# Patient Record
Sex: Female | Born: 1950 | Race: White | Hispanic: No | Marital: Married | Smoking: Former smoker
Health system: Southern US, Community
[De-identification: ages and names within clinical notes are randomized; demographics above are authoritative.]

## PROBLEM LIST (undated history)

## (undated) DIAGNOSIS — L409 Psoriasis, unspecified: Secondary | ICD-10-CM

## (undated) DIAGNOSIS — M2550 Pain in unspecified joint: Secondary | ICD-10-CM

## (undated) DIAGNOSIS — E039 Hypothyroidism, unspecified: Secondary | ICD-10-CM

## (undated) DIAGNOSIS — E785 Hyperlipidemia, unspecified: Secondary | ICD-10-CM

## (undated) DIAGNOSIS — Z8619 Personal history of other infectious and parasitic diseases: Secondary | ICD-10-CM

## (undated) DIAGNOSIS — I10 Essential (primary) hypertension: Secondary | ICD-10-CM

## (undated) DIAGNOSIS — M1990 Unspecified osteoarthritis, unspecified site: Secondary | ICD-10-CM

## (undated) DIAGNOSIS — K222 Esophageal obstruction: Secondary | ICD-10-CM

## (undated) DIAGNOSIS — K219 Gastro-esophageal reflux disease without esophagitis: Secondary | ICD-10-CM

## (undated) DIAGNOSIS — Z860100 Personal history of colon polyps, unspecified: Secondary | ICD-10-CM

## (undated) DIAGNOSIS — G4700 Insomnia, unspecified: Secondary | ICD-10-CM

## (undated) DIAGNOSIS — K2270 Barrett's esophagus without dysplasia: Secondary | ICD-10-CM

## (undated) DIAGNOSIS — M2540 Effusion, unspecified joint: Secondary | ICD-10-CM

## (undated) DIAGNOSIS — R531 Weakness: Secondary | ICD-10-CM

## (undated) DIAGNOSIS — G8929 Other chronic pain: Secondary | ICD-10-CM

## (undated) DIAGNOSIS — Z9289 Personal history of other medical treatment: Secondary | ICD-10-CM

## (undated) DIAGNOSIS — K5790 Diverticulosis of intestine, part unspecified, without perforation or abscess without bleeding: Secondary | ICD-10-CM

## (undated) DIAGNOSIS — M549 Dorsalgia, unspecified: Secondary | ICD-10-CM

## (undated) DIAGNOSIS — I359 Nonrheumatic aortic valve disorder, unspecified: Secondary | ICD-10-CM

## (undated) DIAGNOSIS — H269 Unspecified cataract: Secondary | ICD-10-CM

## (undated) DIAGNOSIS — G5600 Carpal tunnel syndrome, unspecified upper limb: Secondary | ICD-10-CM

## (undated) HISTORY — DX: Barrett's esophagus without dysplasia: K22.70

## (undated) HISTORY — DX: Hypothyroidism, unspecified: E03.9

## (undated) HISTORY — PX: COLONOSCOPY: SHX174

## (undated) HISTORY — PX: OTHER SURGICAL HISTORY: SHX169

## (undated) HISTORY — DX: Esophageal obstruction: K22.2

## (undated) HISTORY — DX: Nonrheumatic aortic valve disorder, unspecified: I35.9

## (undated) HISTORY — PX: TUBAL LIGATION: SHX77

## (undated) HISTORY — PX: CARPAL TUNNEL RELEASE: SHX101

## (undated) HISTORY — DX: Essential (primary) hypertension: I10

## (undated) HISTORY — PX: TRIGGER FINGER RELEASE: SHX641

## (undated) HISTORY — PX: ESOPHAGOGASTRODUODENOSCOPY: SHX1529

---
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
## 1998-06-23 ENCOUNTER — Ambulatory Visit (HOSPITAL_COMMUNITY): Admission: RE | Admit: 1998-06-23 | Discharge: 1998-06-23 | Payer: Self-pay | Admitting: Obstetrics and Gynecology

## 1998-06-23 ENCOUNTER — Encounter: Payer: Self-pay | Admitting: Obstetrics and Gynecology

## 1998-12-11 ENCOUNTER — Other Ambulatory Visit: Admission: RE | Admit: 1998-12-11 | Discharge: 1998-12-11 | Payer: Self-pay | Admitting: Obstetrics and Gynecology

## 2000-04-07 ENCOUNTER — Other Ambulatory Visit: Admission: RE | Admit: 2000-04-07 | Discharge: 2000-04-07 | Payer: Self-pay | Admitting: Obstetrics and Gynecology

## 2001-04-20 ENCOUNTER — Ambulatory Visit (HOSPITAL_COMMUNITY): Admission: RE | Admit: 2001-04-20 | Discharge: 2001-04-20 | Payer: Self-pay | Admitting: Gastroenterology

## 2001-07-01 ENCOUNTER — Other Ambulatory Visit: Admission: RE | Admit: 2001-07-01 | Discharge: 2001-07-01 | Payer: Self-pay | Admitting: Obstetrics and Gynecology

## 2002-08-04 ENCOUNTER — Other Ambulatory Visit: Admission: RE | Admit: 2002-08-04 | Discharge: 2002-08-04 | Payer: Self-pay | Admitting: Obstetrics and Gynecology

## 2003-10-06 ENCOUNTER — Other Ambulatory Visit: Admission: RE | Admit: 2003-10-06 | Discharge: 2003-10-06 | Payer: Self-pay | Admitting: *Deleted

## 2005-01-10 ENCOUNTER — Other Ambulatory Visit: Admission: RE | Admit: 2005-01-10 | Discharge: 2005-01-10 | Payer: Self-pay | Admitting: Obstetrics and Gynecology

## 2005-10-31 ENCOUNTER — Inpatient Hospital Stay (HOSPITAL_BASED_OUTPATIENT_CLINIC_OR_DEPARTMENT_OTHER): Admission: RE | Admit: 2005-10-31 | Discharge: 2005-10-31 | Payer: Self-pay | Admitting: *Deleted

## 2006-11-27 ENCOUNTER — Ambulatory Visit (HOSPITAL_COMMUNITY): Admission: RE | Admit: 2006-11-27 | Discharge: 2006-11-27 | Payer: Self-pay | Admitting: *Deleted

## 2006-11-27 ENCOUNTER — Encounter (INDEPENDENT_AMBULATORY_CARE_PROVIDER_SITE_OTHER): Payer: Self-pay | Admitting: *Deleted

## 2006-12-02 ENCOUNTER — Ambulatory Visit: Payer: Self-pay | Admitting: Surgery

## 2006-12-02 HISTORY — PX: OTHER SURGICAL HISTORY: SHX169

## 2006-12-09 ENCOUNTER — Ambulatory Visit: Payer: Self-pay | Admitting: Surgery

## 2006-12-10 ENCOUNTER — Inpatient Hospital Stay (HOSPITAL_COMMUNITY): Admission: RE | Admit: 2006-12-10 | Discharge: 2006-12-15 | Payer: Self-pay | Admitting: Surgery

## 2006-12-10 ENCOUNTER — Ambulatory Visit: Payer: Self-pay | Admitting: Surgery

## 2006-12-10 ENCOUNTER — Encounter (INDEPENDENT_AMBULATORY_CARE_PROVIDER_SITE_OTHER): Payer: Self-pay | Admitting: Specialist

## 2007-01-06 ENCOUNTER — Ambulatory Visit: Payer: Self-pay | Admitting: Surgery

## 2007-01-12 ENCOUNTER — Encounter (HOSPITAL_COMMUNITY): Admission: RE | Admit: 2007-01-12 | Discharge: 2007-04-12 | Payer: Self-pay | Admitting: *Deleted

## 2007-01-22 ENCOUNTER — Emergency Department (HOSPITAL_COMMUNITY): Admission: EM | Admit: 2007-01-22 | Discharge: 2007-01-22 | Payer: Self-pay | Admitting: Emergency Medicine

## 2007-04-13 ENCOUNTER — Encounter (HOSPITAL_COMMUNITY): Admission: RE | Admit: 2007-04-13 | Discharge: 2007-05-03 | Payer: Self-pay | Admitting: *Deleted

## 2009-01-05 ENCOUNTER — Encounter: Admission: RE | Admit: 2009-01-05 | Discharge: 2009-01-05 | Payer: Self-pay | Admitting: Gastroenterology

## 2009-02-16 ENCOUNTER — Ambulatory Visit (HOSPITAL_COMMUNITY): Admission: RE | Admit: 2009-02-16 | Discharge: 2009-02-16 | Payer: Self-pay | Admitting: Gastroenterology

## 2009-02-16 ENCOUNTER — Encounter (INDEPENDENT_AMBULATORY_CARE_PROVIDER_SITE_OTHER): Payer: Self-pay | Admitting: Gastroenterology

## 2009-05-20 IMAGING — CR DG CHEST 1V PORT
1 series · 1 of 1 positions shown · non-contrast
Comparison: none

HISTORY: Chest pain

PORTABLE CHEST ONE VIEW:
Portable exam 2292 hours compared to 12/12/2006
Normal heart size status post median sternotomy and AVR.
Mediastinal contours and pulmonary vascularity normal.
Lungs clear.
No pleural effusion or pneumothorax.

[view not recorded]
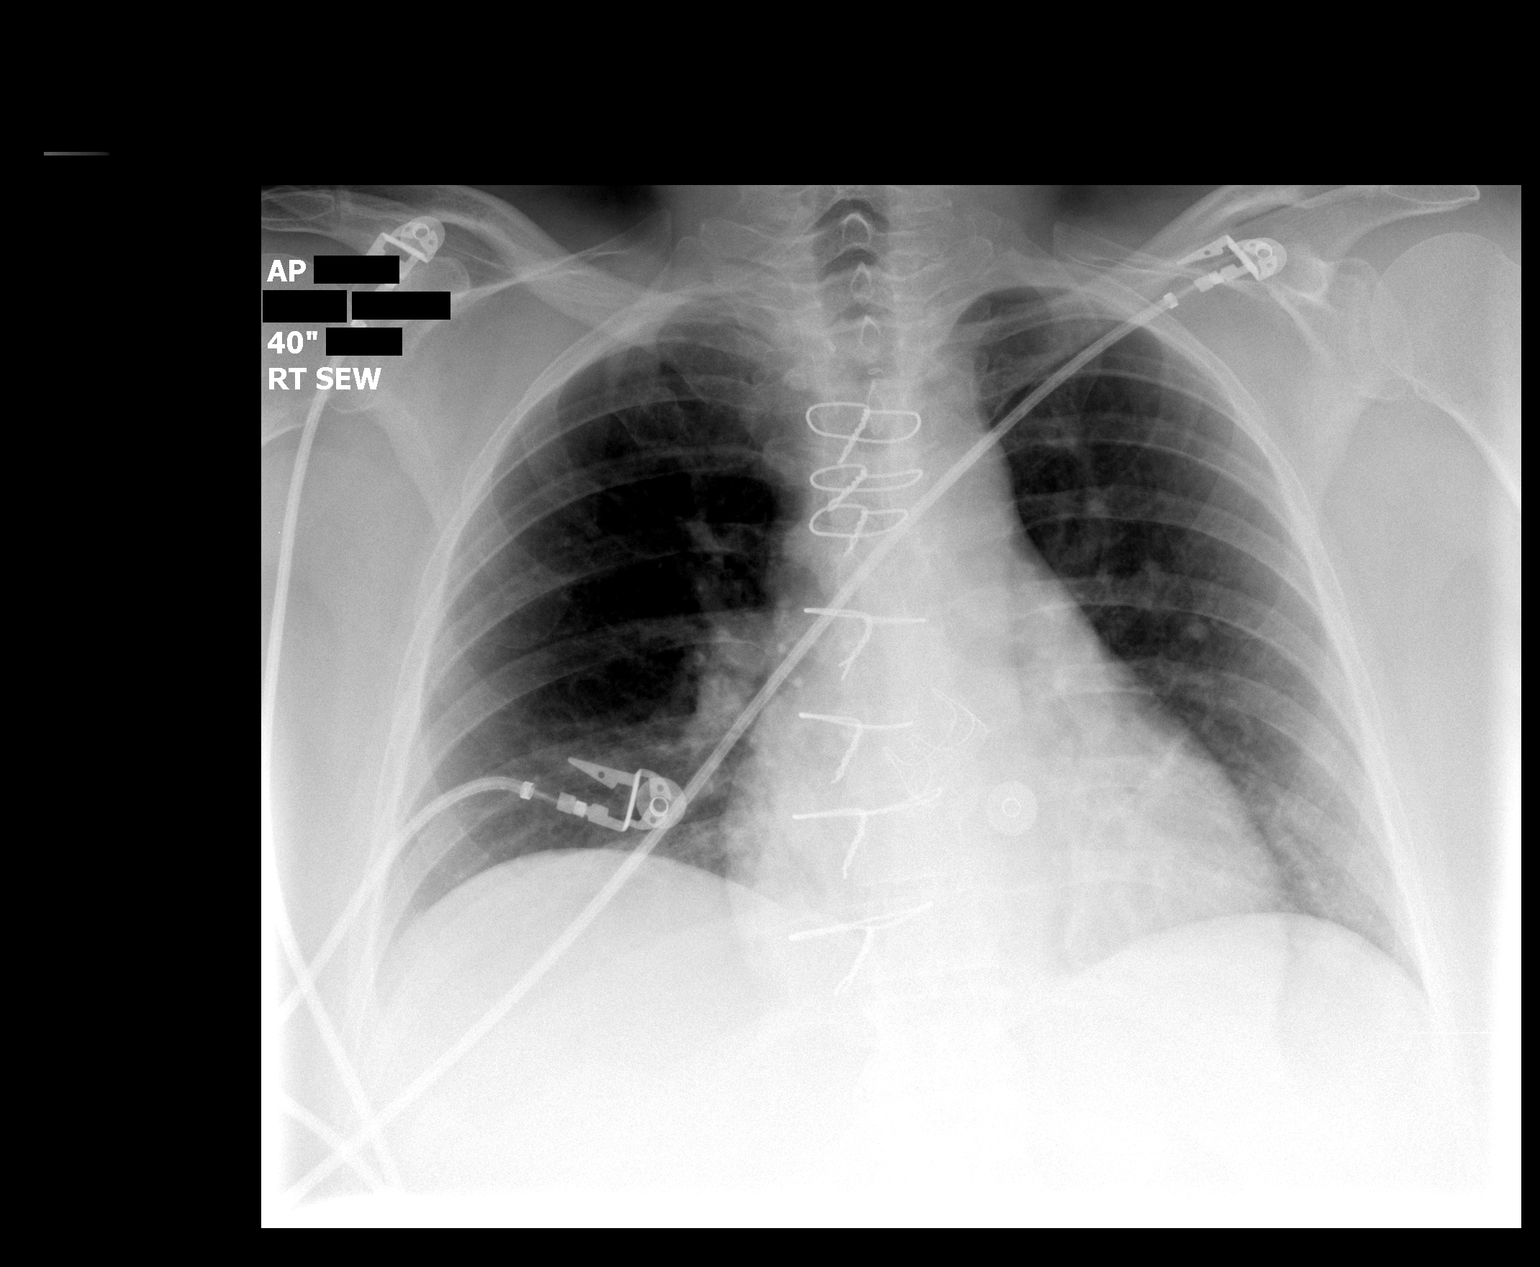

[1 of 1 positions shown; findings below may reference images not displayed]

IMPRESSION: No acute abnormalities.

## 2009-05-20 IMAGING — CT CT ANGIO CHEST
2 of 4 series · 19 of 36 positions shown · IV contrast (APPLIED)
Comparison: none

HISTORY: Dyspnea, chest pain, elevated d-dimer, question pulmonary embolism

[Series 5: pulm embolism 2.0 st · axial · 0.63mm/px · z∈[-330,-90]mm · 16 of 132 slices shown]
[im 6/132  lung]
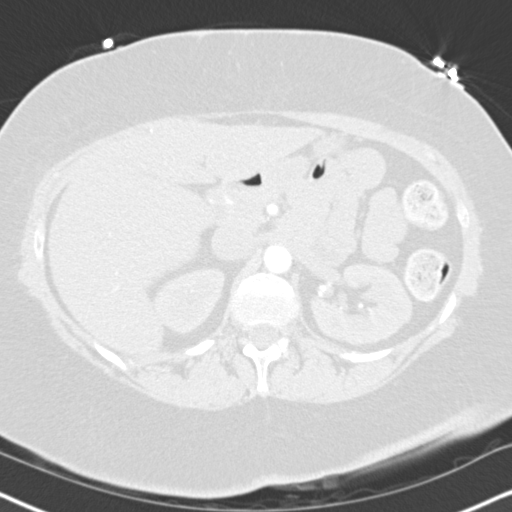
[im 16/132  mediastinal]
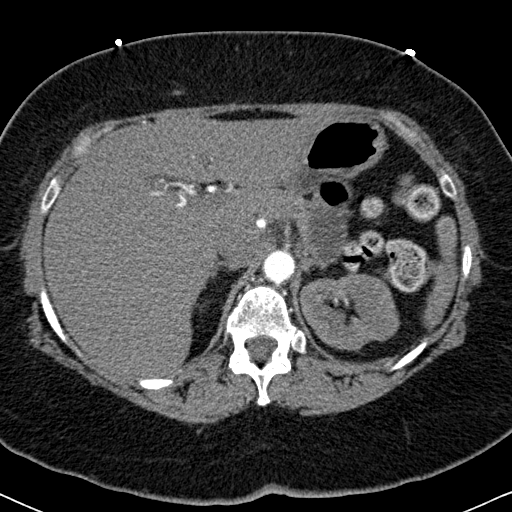
[im 21/132  lung]
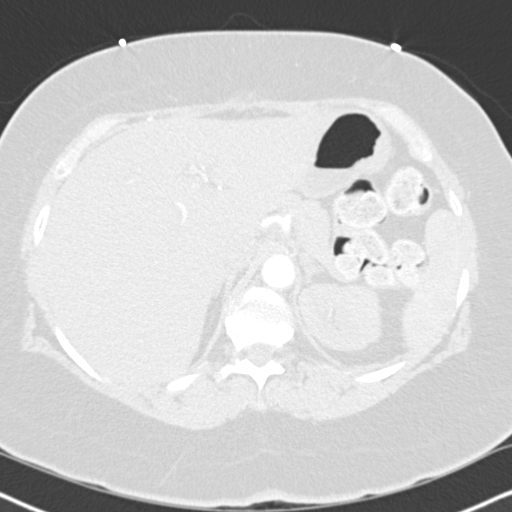
[im 31/132  mediastinal]
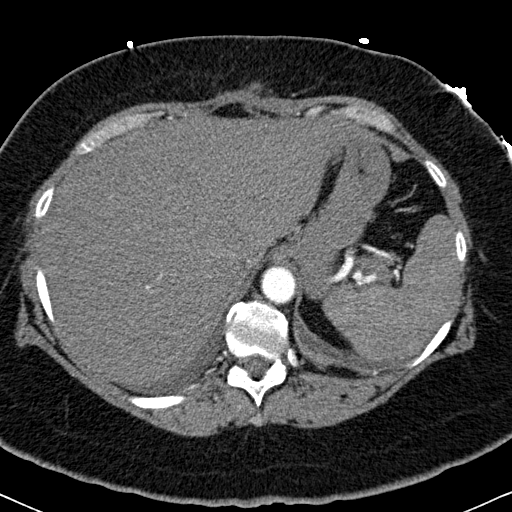
[im 41/132  lung]
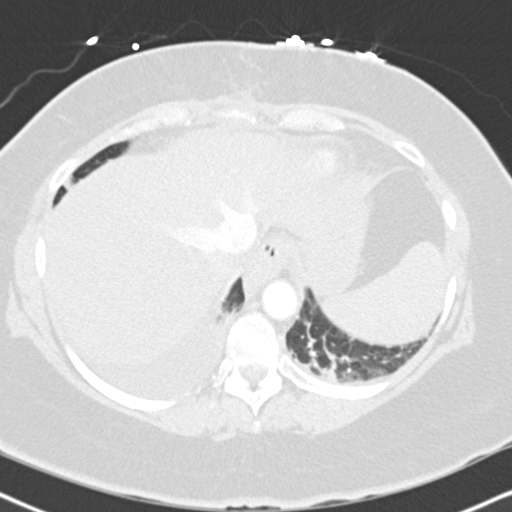
[im 46/132  mediastinal]
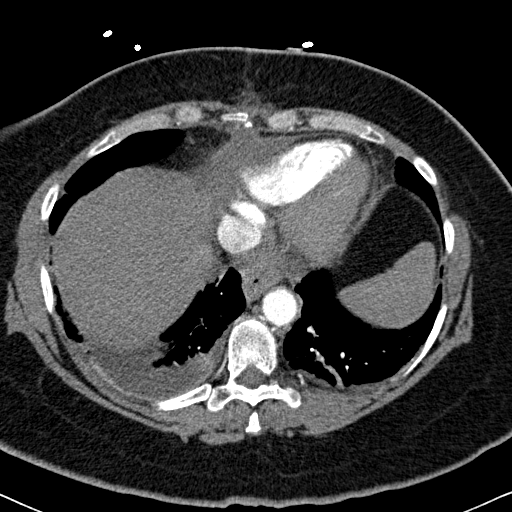
[im 56/132  lung]
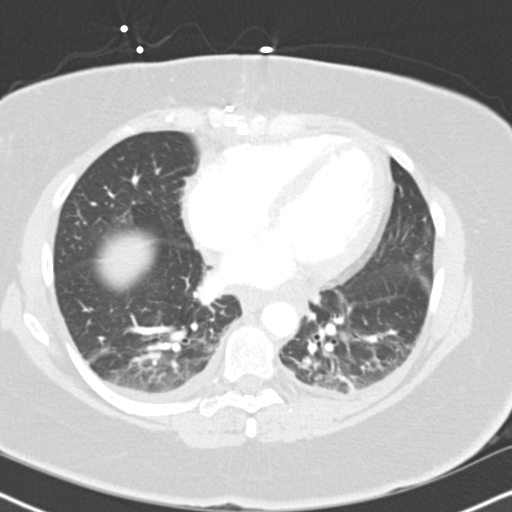
[im 61/132  mediastinal]
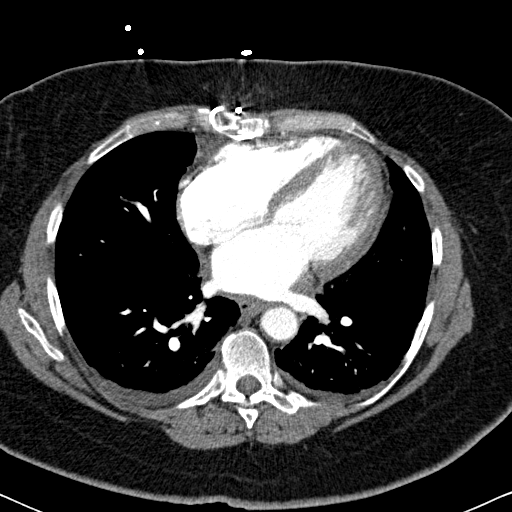
[im 71/132  lung]
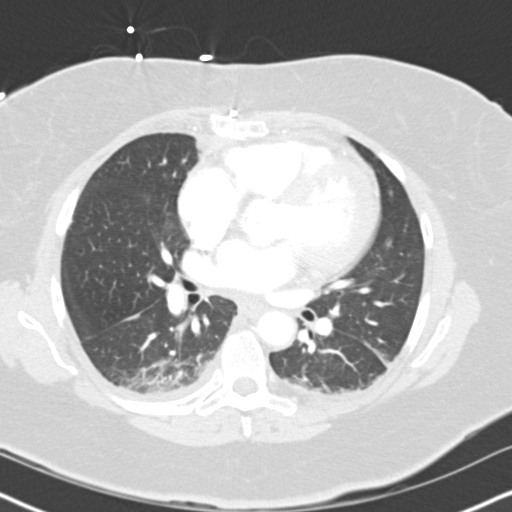
[im 76/132  mediastinal]
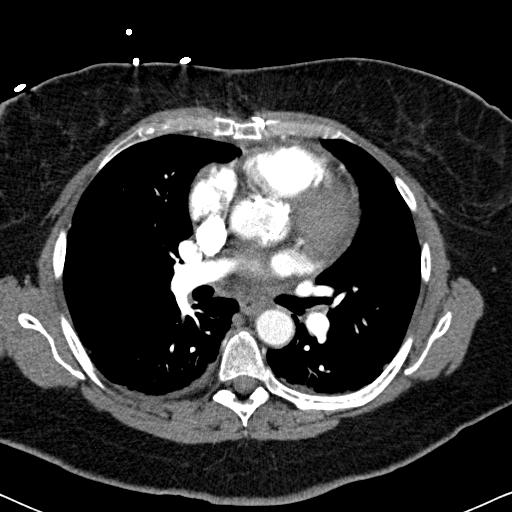
[im 86/132  lung]
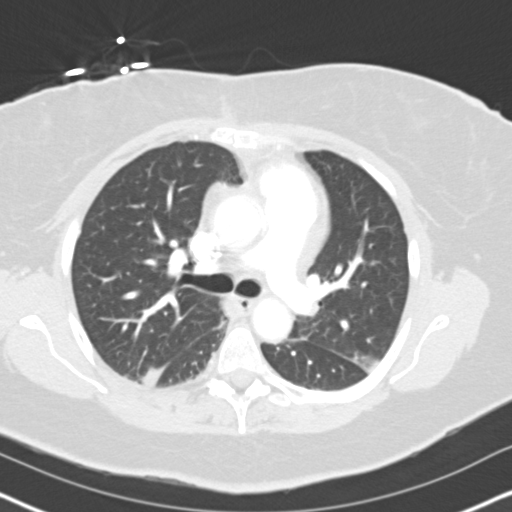
[im 91/132  mediastinal]
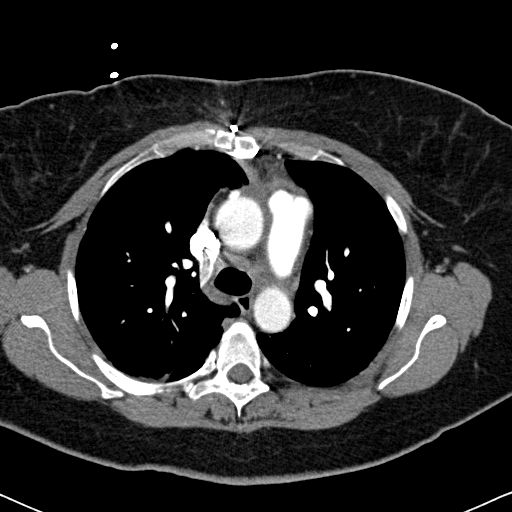
[im 101/132  lung]
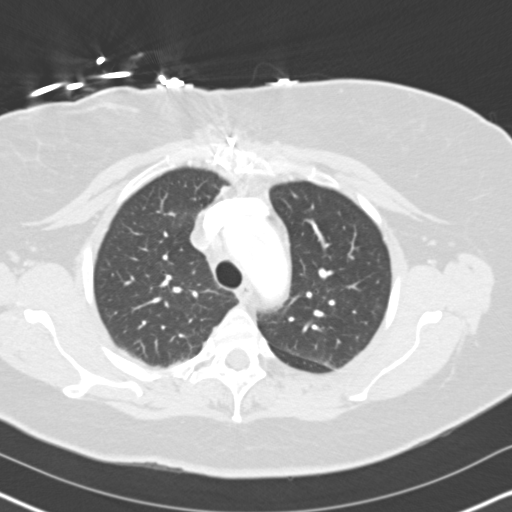
[im 111/132  mediastinal]
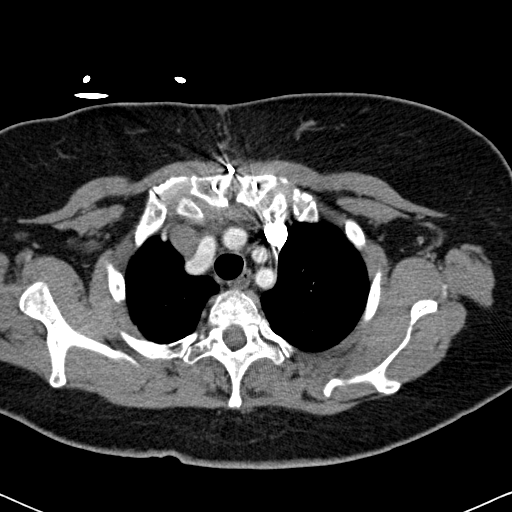
[im 116/132  lung]
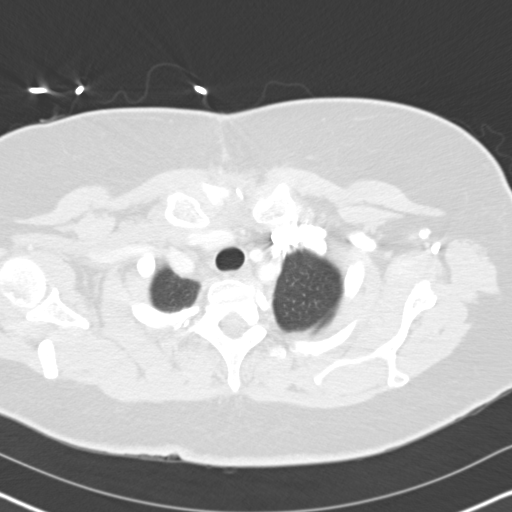
[im 126/132  mediastinal]
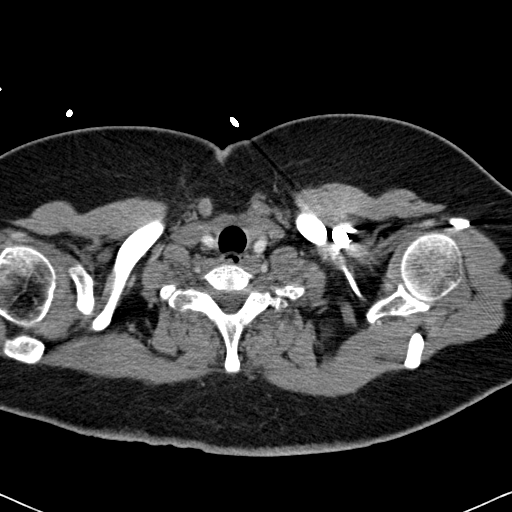

[Series 7: pulm embolism 2.0 cor · coronal · 0.63mm/px · 3 of 108 slices shown]
[im 22/108  mediastinal]
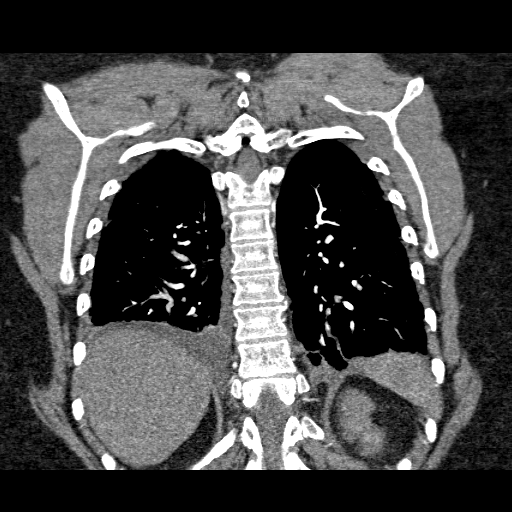
[im 43/108  mediastinal]
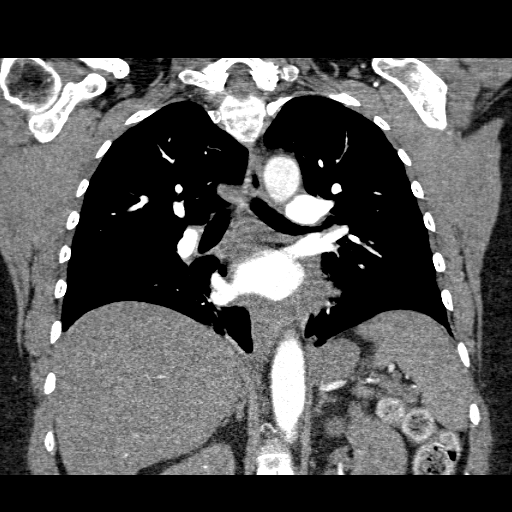
[im 65/108  mediastinal]
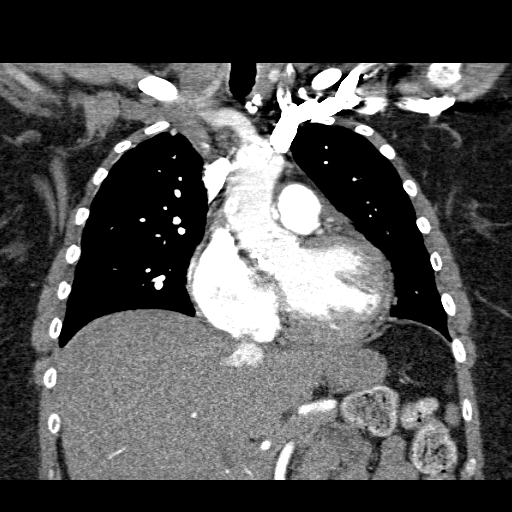

[19 of 36 positions shown; findings below may reference images not displayed]

CT ANGIO CHEST:

Multidetector helical CT imaging chest performed using pulmonary embolism
protocol following 80 cc [id number]. Additional coronal and sagittal images
are reconstructed from the axial data set to assist in evaluation of the
thoracic vasculature.
No prior exam for comparison.

Aorta normal caliber without aneurysm or dissection.
Status post CABG with stranding in anterior mediastinum compatible with recent
surgery.
Minimal pericardial thickening.
Small bilateral pleural effusions, greater on right.
Pulmonary arteries patent.
No evidence of pulmonary embolism.
Visualized portions of upper abdomen unremarkable.
Remaining lungs clear.
IMPRESSION: Postsurgical changes of CABG.
Small bilateral pleural effusions and bibasilar atelectasis.
No evidence of pulmonary embolism.

## 2009-08-16 ENCOUNTER — Encounter: Admission: RE | Admit: 2009-08-16 | Discharge: 2009-08-16 | Payer: Self-pay | Admitting: Obstetrics and Gynecology

## 2010-10-06 ENCOUNTER — Encounter: Payer: Self-pay | Admitting: Internal Medicine

## 2010-10-10 ENCOUNTER — Ambulatory Visit
Admission: RE | Admit: 2010-10-10 | Discharge: 2010-10-10 | Disposition: A | Discharge status: Home / Self Care | Payer: BC Managed Care – PPO | Source: Ambulatory Visit | Attending: Internal Medicine | Admitting: Internal Medicine

## 2010-10-10 ENCOUNTER — Other Ambulatory Visit: Payer: Self-pay | Admitting: Internal Medicine

## 2010-10-10 DIAGNOSIS — M545 Low back pain, unspecified: Secondary | ICD-10-CM

## 2010-10-11 ENCOUNTER — Other Ambulatory Visit: Payer: Self-pay | Admitting: Neurological Surgery

## 2010-10-11 ENCOUNTER — Ambulatory Visit
Admission: RE | Admit: 2010-10-11 | Discharge: 2010-10-11 | Disposition: A | Discharge status: Home / Self Care | Payer: BC Managed Care – PPO | Source: Ambulatory Visit | Attending: Neurological Surgery | Admitting: Neurological Surgery

## 2010-10-11 DIAGNOSIS — M4316 Spondylolisthesis, lumbar region: Secondary | ICD-10-CM

## 2011-01-09 ENCOUNTER — Other Ambulatory Visit: Payer: Self-pay | Admitting: *Deleted

## 2011-01-09 MED ORDER — LISINOPRIL 20 MG PO TABS: 20.0000 mg | ORAL_TABLET | Freq: Every day | ORAL | Status: DC

## 2011-01-09 NOTE — Telephone Encounter (Signed)
Refill to [facility]

## 2011-01-15 NOTE — Op Note (Signed)
NAMEJINX, Amy Russo               ACCOUNT NO.:  [id number]   MEDICAL RECORD NO.:  [id number]          PATIENT TYPE:  AMB   LOCATION:  ENDO                         FACILITY:  [facility]   PHYSICIAN:  Bernette Redbird, M.D.   DATE OF BIRTH:  Sep 16, 1950   DATE OF PROCEDURE:  02/16/2009  DATE OF DISCHARGE:                               OPERATIVE REPORT   PROCEDURE:  Upper endoscopy with Savary dilatation of the esophagus  under fluoroscopy, and biopsies.   INDICATION:  A 60 year old female with recurrent dysphagia symptoms and  transient food impactions, and barium swallow showing a tight narrowing  in the region of the distal esophagus.   FINDINGS:  Desquamating severe esophagitis with ring-like narrowing but  no discrete esophageal ring at the gastroesophageal junction, dilated to  18 mm.  Small hiatal hernia.   PROCEDURE:  The patient came as an outpatient to the [facility]  Endoscopy Unit and provided written consent.  Sedation was MAC by the  anesthesia team.  The Pentax video endoscope was passed under direct  vision.  The vocal cords were not well seen.   The esophagus was normal proximally but, for several centimeters above  the GE junction, there were areas of inflammation characterized by  exudate, and desquamating mucosa, and underlying erythematous mucosa  which I biopsied at the conclusion of the procedure to see if it was  Barrett's mucosa.  There was a ring-like narrowing but it was fairly  widely patent and offered no resistance passage of the endoscope.  Below  this was a 3-cm hiatal hernia.   The stomach was entered.  It contained no significant residual.  The  gastric mucosa was normal, and no gastritis, erosions, ulcers, polyps or  masses were observed.  A retroflexed view of the cardia of the stomach  was unremarkable and the pylorus, duodenal bulb and second duodenum  looked normal.   Savary dilatation was then performed in the standard fashion.  The  spring  tipped guidewire was passed into the antrum of the stomach and  the scope was removed in an exchange fashion, leaving the guidewire in  place.  Sequential dilatation was then performed under fluoroscopic  guidance, using Savary dilators, sizes 16 and 18 mm, with smooth  resistance and no discrete pop or click as I passed and below the  level of the diaphragm as confirmed fluoroscopically.  Reinspection  endoscopically after passage of the larger dilator showed some fresh  hemorrhage but no brisk bleeding.  There might have been some ongoing  oozing.  Several biopsies were obtained from the distal esophagus as  noted above.  There was no endoscopic evidence of undue trauma or  perforation.  The scope was removed from the patient who tolerated the  procedure well and without apparent complications.   IMPRESSION:  1. Distal esophagitis and possible Barrett's esophagus.  2. Distal esophageal narrowing, dilated to 18 mm as described above.  3. Small hiatal hernia.   PLAN:  Await pathology results and clinical follow-up of dysphagia  symptoms.           ______________________________  Bernette Redbird, M.D.     RB/MEDQ  D:  02/16/2009  T:  02/16/2009  Job:  [id number]   cc:   Geoffry Paradise, M.D.  Fax: [phone]

## 2011-01-15 NOTE — Op Note (Signed)
NAMECAMIRA, Amy Russo               ACCOUNT NO.:  [id number]   MEDICAL RECORD NO.:  [id number]          PATIENT TYPE:  AMB   LOCATION:  ENDO                         FACILITY:  [facility]   PHYSICIAN:  Bernette Redbird, M.D.   DATE OF BIRTH:  06/24/1951   DATE OF PROCEDURE:  02/16/2009  DATE OF DISCHARGE:                               OPERATIVE REPORT   PROCEDURE:  Flexible sigmoidoscopy.   INDICATIONS:  A 60 year old female with recurrent small-volume  hematochezia in association with significant constipation.  Colonoscopy  2-1/2 years ago was unrevealing as was sigmoidoscopic evaluation about a  year ago.   FINDINGS:  Normal exam to 40 cm.  No obvious source of bleeding  identified.   PROCEDURE:  The patient provided written consent for the procedure,  having come as an outpatient to the [facility] Endoscopy Unit.  This  procedure was performed immediately following her upper endoscopy and  the endoscope was used as a sigmoidoscope for this exam.  It was  inserted after a Fleet's enema prep and easily advanced to 40 cm.  Pullback was then performed.   This was a normal examination.  No polyps, masses, proctitis, colitis,  vascular ectasia or diverticulosis were noted.  Retroflexion in the  rectum showed perhaps some slightly excoriated distal rectal mucosa but  nothing impressive.  Pullout through the anal canal with careful  inspection failed to show any obvious anal fissures nor were there was  really significant internal hemorrhoids.  No biopsies were obtained.  The patient tolerated the procedure well and with no apparent  complications.   IMPRESSION:  Essentially normal sigmoidoscopy, without source of rectal  bleeding endoscopically evident.   PLAN:  Clinical follow-up.   Note that for this procedure and the upper endoscopy which preceded it,  the patient received antibiotic prophylaxis with ampicillin and  gentamicin, because of the history of the prosthetic tissue  aortic  valve.           ______________________________  Bernette Redbird, M.D.     RB/MEDQ  D:  02/16/2009  T:  02/16/2009  Job:  [id number]   cc:   Geoffry Paradise, M.D.  Fax: [phone]

## 2011-01-18 NOTE — Op Note (Signed)
Amy Russo, Amy Russo               ACCOUNT NO.:  [id number]   MEDICAL RECORD NO.:  [id number]          PATIENT TYPE:  INP   LOCATION:  2302                         FACILITY:  [facility]   PHYSICIAN:  Burna Forts, M.D.DATE OF BIRTH:  1950-11-29   DATE OF PROCEDURE:  DATE OF DISCHARGE:                               OPERATIVE REPORT   PROCEDURE:  Intraoperative transesophageal echocardiogram.   INDICATIONS FOR PROCEDURE:  Amy Russo is a 60 year old woman who  presents today for aortic valve replacement to be performed by Dr. Evelene Croon.   She is brought to the holding area the morning of surgery under local  anesthesia with sedation, the radial, arterial and pulmonary artery  lines are inserted.  She is then taken to the OR for routine induction  of general anesthesia.   After satisfactory induction of anesthesia, the TEE probe is protected,  lubricated and passed oropharyngeally into the stomach and slightly  withdrawn for imaging on cardiac structures.   THE PRECARDIAC PULMONARY TEE EXAMINATION:  Left ventricle:  Left  ventricular chamber seen in a short axis view revealed concentric left  ventricular hypertrophy.  There is excellent overall contractility noted  in all segments of wall areas particularly and in the long axis view  again showing good overall contractility.  Papillary muscles are well  outlined.  There are no masses noted within.  Mitral valve:  There is a mildly thickened mitral valve apparatus noted  in both anterior and posterior leaflets seen in a 4-chamber view.  The  leaflets themselves lifted appropriately during diastolic filling and  closed satisfactorily during systolic ejection.  The color Doppler  across this valve again shows only trace regurgitant flow noted in this  4-chamber view.  Multiple views are taken of the mitral valves and  essentially normal apparatus.  Left atrium:  Normal left atrial chamber.  No masses are noted within.  Intraatrial septum is interrogated and intact.  Right ventricle, tricuspid valve, and right atrium are essentially  normal chambers and structures in their form and function.  Aortic valve:  The aortic valve is bounded by sort of heavy calcium  noted in the annular area and a short axis view it is seen to be  essentially a bicuspid valve with what appears to be essentially a left  and a right cusp.  The cusps are essentially quite fixed and limited in  their opening.  During systolic ejection across this valve, there is a  3+ systolic ejection jet showing turbulent flows with a central  turbulent area of jet noted in the short and long axis views.  Hemodynamic calculations of the valve and across the valve indicate a  peak gradient around 44 and a mean of around 40.  The aortic valve area  is calculated at 0.7 cm squares using our Doppler technique.   The patient is placed on cardiopulmonary bypass.  The aortotomy is  performed.  The bicuspid valve is excised and replaced with a #23  pericardial tissue valve by Dr. Laneta Simmers.  The aortotomy is closed.  Multiple de-airing maneuvers are carried out and  the patient is prepared  for separation of cardiopulmonary bypass.   POSTCARDIOPULMONARY TEE EXAMINATION (LIMITED EXAMINATION):  Left  ventricle in short axis view:  In the left ventricle and short axis  view, I see multiple small microbubbles both in the left ventricular  outflow tract and subvalvular area.  Multiple techniques are used to  help de-air the left ventricular chamber as well as the area of the  subvalvular replacement by Dr. Laneta Simmers.  After a period of time, most of  these microbubbles are then removed and the patient was able to be  separated from cardiopulmonary bypass with initial attempt.  Exam of the  left ventricular chamber on both short axis and long axis views again  revealed good overall contractile patterns noted with some slight  diminution perhaps in the septal wall  area in the early post bypass  period.   Aortic valve now in place, the diseased valve could be seen.  The 3  thinly edged leaflets of the newly placed aortic valve, all leaflets  were opening satisfactorily and posed no obstruction to flow during  systolic ejection.  The valve appeared to be seated well and on left  ventricular outflow chamber views, did not demonstrate any aortic  regurgitant jets noted either perivalvular or intravalvular.  There  appeared to be a satisfactory replacement.   The rest of the cardiac examination was reviewed as well, both right  ventricular and mitral areas as well as tricuspid.  These revealed no  significant changes from the prebypass period.  The patient was returned  to the cardiac intensive care unit in stable condition.           ______________________________  Burna Forts, M.D.     JTM/MEDQ  D:  12/10/2006  T:  12/10/2006  Job:  [id number]

## 2011-01-18 NOTE — Procedures (Signed)
[facility]  Patient:    Amy Russo, Amy Russo Visit Number: [id number] MRN: [id number]          Service Type: END Location: ENDO Attending Physician:  Rich Brave Proc. Date: 04/20/01 Adm. Date:  [id number]   CC:         Richard A. Jacky Kindle, M.D.   Procedure Report  PROCEDURE:  Colonoscopy.  INDICATION:  Intermittent rectal bleeding and remote family history of colon cancer in a 60 year old female.  FINDINGS:  Normal exam to the cecum.  DESCRIPTION OF PROCEDURE:  The nature, purpose, and risks of the procedure had been reviewed with the patient, who provided written consent.  Sedation was fentanyl 70 mcg and Versed 7 mg IV without arrhythmias or desaturation.  The Olympus adult video colonoscope was advanced to the cecum, and pullback was then performed.  The quality of the prep was very good, and it is felt that all areas were well-seen.  This was a normal examination.  No polyps, cancer, colitis, vascular malformations, or diverticulosis were appreciated.  Retroflexion was not attempted in the rectum due to a small rectal ampulla, but careful antegrade viewing disclosed no lesions.  There were mild internal hemorrhoids in the anal canal, nothing impressive.  No source of the patients intermittent rectal bleeding was really seen, but it is presumed most likely to be of hemorrhoidal origin, perhaps due to periodic hemorrhoidal engorgement.  No biopsies were obtained.  The patient tolerated this procedure well, and there were no apparent complications.  IMPRESSION:  Normal colonoscopy.  PLAN:  Follow-up colonoscopy in five years because of the family history of colon cancer in her grandfather and for routine screening purposes. DD:  04/20/01 TD:  04/21/01 Job: [id number]

## 2011-01-18 NOTE — Discharge Summary (Signed)
NAMEKORRIE, HOFBAUER               ACCOUNT NO.:  [id number]   MEDICAL RECORD NO.:  [id number]          PATIENT TYPE:  INP   LOCATION:  2037                         FACILITY:  [facility]   PHYSICIAN:  Evelene Croon, M.D.     DATE OF BIRTH:  1950/11/29   DATE OF ADMISSION:  12/10/2006  DATE OF DISCHARGE:  12/15/2006                               DISCHARGE SUMMARY   ANTICIPATED DATE OF DISCHARGE:  December 15, 2006.   ADMISSION DIAGNOSIS:  Critical aortic stenosis.   DISCHARGE/SECONDARY DIAGNOSES:  1. Critical aortic stenosis status post aortic valve replacement.  2. History of oral surgery for benign tumor.  3. History of tubal ligation.  4. Diabetes mellitus type 2, on insulin.  5. Hypothyroidism.  6. Left arm arterial repair for a fall  through a glass door in 1958.   ALLERGIES:  NO KNOWN DRUG ALLERGIES.   PROCEDURE:  1. December 10, 2006, median sternotomy, extracorporeal circulation aortic      valve replacement using a 23 mm Edwards pericardial magna valve by      Dr. Evelene Croon.  2. December 10, 2006, intraoperative transesophageal echocardiogram by Dr.      Ester Rink.  See dictated report for details.   BRIEF HISTORY:  Ms. Shadd is a 60 year old female referred to Dr.  Laneta Simmers by Dr. Lady Deutscher with a history of diabetes as well as aortic  stenosis due to bicuspid aortic valve.  Previous echocardiogram showed  moderate to severe aortic stenosis.  Most recently on November 17, 2006, it  showed worsening of her aortic stenosis with a severely calcified valve  with peak gradient of 49 and a meek gradient of 33.  Aortic valve area  was 0.55 cm squared.  The left ventricular function was normal with  ejection fraction of 55 to 60% with mild left ventricular hypertrophy.  Cardiac catheterization 1 year ago  showed normal coronaries.  She  subsequently underwent transesophageal echocardiogram on November 27, 2006  which showed severe aortic stenosis with aortic valve area of 0.7 to 0.8  cm squared.  The mitral valve was structuring normal with no significant  mitral regurgitation or stenosis.  There was trivial tricuspid  regurgitation.  After exam of the cardiac catheterization and  echocardiogram, and the patient, Dr. Laneta Simmers discussed treatment options.  He recommended aortic valve replacement.  She has a history of frequent  nose bleeds and therefore she was very concerned about getting a valve  requiring anticoagulation.  Therefore, decision was made to use a  pericardial tissue valve.   HOSPITAL COURSE:  Ms. Mcclarty was electively admitted to [facility] on December 10, 2006 and underwent aortic valve replacement as  mentioned.  Postoperatively she was transferred to the surgical  tentative care unit where she remained until postoperative day 2.  While  in the unit, tubes and lines were discontinued.  Once in telemetry 2000,  she was able to tolerate low-dose of beta blocker therapy and was  maintaining normal sinus rhythm.  She was restarted on her home Lantus  regimen with sliding scale NovoLog insulin.  She  was able to ambulate  without difficulty.  Seen by cardiac rehab.  She is tolerating a regular  diet and voiding without difficulty.  She did require __________  for  prophylaxis for postoperative consultation.   PHYSICAL EXAMINATION:  Her physical exam showed:  HEART:  Regular rate and rhythm.  ABDOMINAL EXAM:  Benign.  LUNGS:  Clear.  EXTREMITIES:  Showed only trace edema.  __________.   Chest x-ray on December 12, 2006, showed no pneumothorax with bibasilar  atelectasis.  Vitals remained stable.  Most recently blood pressure was  102/64, heart rate 85, temperature 98, oxygen saturation 99% on room  air.   LABORATORY DATA:  Sodium 134, potassium 4.1, chloride 108, CO2 28, blood  glucose 121, BUN 15, creatinine 0.73.  White blood count 14.7, which was  decreasing, and there were no obvious signs of infection.  Hemoglobin  11.7, hematocrit 33.5,  platelet count 155,000.   If Mrs. Polson continues to make good progress as anticipated, she will  be ready for discharge home on postoperative day 5 or 6, April 14 or  December 16, 2006.  The patient's __________ discontinued prior to  discharge.   DISCHARGE MEDICATIONS:  1. Aspirin 81 mg p.o. daily.  2. Lopressor 25 mg p.o. daily.  3. Synthroid 88 mcg daily.  4. Lantus insulin at 35 units subcutaneous every p.m.  5. NovoLog insulin 2 to 6 units q.i.d. per her home sliding scale      regimen.  6. Ambien CR q.h.s. p.r.n. per her home regimen.  7. She may resume multivitamin, calcium, vitamin D, glucosamine, and      chondroitin, and vitamin B quick supplement daily as desired by her      home regimen.  8. Oxycodone 5 mg one to two tablets every  four hours p.r.n. pain.   DISCHARGE INSTRUCTIONS:  She is instructed no driving, heavy lifting  more than 10 pounds.  Urged to continue daily work of breathing  exercises.  She is to follow a low-fat, low-salt, carbohydrate modified  diet.  She is to continue routine monitoring of her blood sugars.  She  may shower.  Clean her incision gently with mild soap and water.  She is  to call if she develops fever greater than 101, or redness or drainage  per her incision site.  She is to follow up with Dr. Lady Deutscher in his  office in 2 weeks with a chest x-ray.  She should see Dr. Laneta Simmers in his  office in 3 weeks and bring her most recent chest x-ray.  Our office  will contact her regarding specific date and time.      Jerold Coombe, P.A.      Evelene Croon, M.D.  Electronically Signed   AWZ/MEDQ  D:  12/14/2006  T:  12/14/2006  Job:  [id number]   cc:   Elmore Guise., M.D.  Geoffry Paradise, M.D.

## 2011-01-18 NOTE — Cardiovascular Report (Signed)
[name], [name]               ACCOUNT NO.:  [id number]   MEDICAL RECORD NO.:  [id number]          PATIENT TYPE:  OIB   LOCATION:  1962                         FACILITY:  [facility]   PHYSICIAN:  Elmore Guise., M.D.DATE OF BIRTH:  [birth year]   DATE OF PROCEDURE:  10/31/2005  DATE OF DISCHARGE:                              CARDIAC CATHETERIZATION   PROCEDURE:  Right and left cardiac catheterization.   INDICATIONS FOR PROCEDURE:  Severe aortic stenosis, indeterminate stress  test.   DESCRIPTION OF PROCEDURE:  The patient was brought to the cardiac cath lab  after appropriate informed consent. She was prepped and draped in sterile  fashion. A #7 French sheath was placed in the right femoral vein and a #5  [place] sheath was placed in the right femoral artery without difficulty.  Approximately 20 cc of 1% lidocaine were used for a local anesthesia. Right  heart catheterization was performed, followed by left heart catheterization,  coronary angiogram, LV angiogram without difficulty. The patient remained  stable throughout her procedure and was transferred from the cardiac cath  lab in stable condition.   FINDINGS:  Right heart catheterization measurements as follows:  1.  Right atrium: 12 mmHg.  2.  RV: 40/8 mmHg.  3.  PA: 34/18 mmHg.  4.  PCWP: 16 mmHg.  5.  LVEDP: 16 mmHg. Cardiac output 4.7 liters per minute, with a cardiac      index of 2.5 liters per minute per meter squared.   Coronaries:  1.  Left main: Normal appearing.  2.  LAD: Normal appearing.  3.  D1: Large vessel, normal appearing.  4.  LCX: Dominant, normal appearing.  5.  OM1/OM2: Moderate to large vessel, normal appearing.  6.  RCA: Nondominant high branching vessel, normal appearing.  7.  LV: EF 60%. No wall motion abnormalities. LVEDP is 16 mmHg. Aortic valve      gradient of 14 mmHg for a peak and 11 for mean, given an aortic valve      area of 0.97 cm squared.   IMPRESSION:  1.  Moderate to severe  aortic stenosis (aortic valve area of 0.97 cm      squared).  2.  Normal appearing coronary arteries.  3.  Borderline high pulmonary pressures consistent with very mild pulmonary      hypertension.   PLAN:  The patient should have SBE prophylaxis with any dental, GI or GU  procedure. She should also have close yearly followup with echocardiograms  and evaluation for progression of her aortic valve disease. Should she  develop symptoms, I would recommend closer followup and possible referral  for valve replacement.      Elmore Guise., M.D.  Electronically Signed     TWK/MEDQ  D:  10/31/2005  T:  10/31/2005  Job:  [id number]   cc:   Geoffry Paradise, M.D.  Fax: [phone]

## 2011-01-18 NOTE — Op Note (Signed)
NAMEKRISTYNE, Amy Russo               ACCOUNT NO.:  [id number]   MEDICAL RECORD NO.:  [id number]          PATIENT TYPE:  INP   LOCATION:  2302                         FACILITY:  [facility]   PHYSICIAN:  Evelene Croon, M.D.     DATE OF BIRTH:  November 21, 1950   DATE OF PROCEDURE:  12/10/2006  DATE OF DISCHARGE:                               OPERATIVE REPORT   PREOPERATIVE AND POSTOPERATIVE DIAGNOSIS:  Critical aortic stenosis.   OPERATIVE PROCEDURE:  Median sternotomy, extracorporeal circulation,  aortic valve replacement using a 23 mm Edwards pericardial Magna valve.   ATTENDING SURGEON:  Dr. Evelene Croon.   ASSISTANT:  Edwina Barth, MD   SECOND ASSISTANT:  Gershon Crane, [facility]   ANESTHESIA:  General endotracheal.   CLINICAL HISTORY:  This patient is a 60 year old woman referred by Dr.  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  Previous echocardiogram had shown moderate  to severe aortic stenosis.  Most recent echocardiogram on 11/17/2006  showed worsening of her aortic stenosis with a severely calcified valve  with peak gradient 49 and a mean gradient of 33.  The aortic valve area  was 0.55 cm2.  The left ventricular function was normal with ejection  fraction of 55-60% with mild left ventricular hypertrophy.  She had  normal right ventricular size and function.  Cardiac catheterization  about 1 year ago showed normal coronaries.  She subsequently went  transesophageal echocardiogram on 11/27/2006 which showed severe aortic  stenosis with an aortic valve area 0.7-0.8 cm2.  The mitral valve was  structurally normal with no significant mitral regurgitation and no  stenosis.  There was trivial tricuspid regurgitation.  After review of  the cardiac catheterization, echocardiograms, and examination the  patient, it was felt that it was time to proceed with aortic valve  replacement.  I spent a considerable amount of time discussing  the valve  choices with her since she was 60 years old.  We discussed the pros and  cons of mechanical and tissue valves.  She does have a history of  frequent nosebleeds for no known reason that have been difficult to  control at times.  She was very concerned about being on Coumadin with  this issue.  When I saw her in the office initially the last week she  had decided that she would probably go ahead with a mechanical valve to  try to decrease her long-term risk of needing recurrent heart surgery.  She went home and thought about it over the past week and when I saw her  back in the office again yesterday, we had another long discussion about  the pros and cons and she had decided after discussing with her husband  and cardiology that a tissue valve would probably be best since she  really did not want to be on Coumadin and was concerned about this nose  bleeding issue.  I agreed that using a tissue valve was a reasonable  choice for her to try to decrease the risk of bleeding and also  thromboembolism  which may be as high as 1-2% per patient year.  She was  fully aware that there was a risk that her valve would deteriorate over  lifetime and require replacement.  I discussed the benefits and risks of  surgery including but not limited to bleeding, blood transfusion,  infection, stroke, myocardial infarction, need for permanent pacemaker,  and death.  She understood and agreed to proceed.   OPERATIVE PROCEDURE:  The patient was taken to the operating room and  placed on table supine position.  After induction of general  endotracheal anesthesia, a Foley catheter was placed in bladder in  sterile technique.  Preoperative intravenous antibiotics were given.  Transesophageal echocardiogram was performed and showed severe aortic  stenosis with a bicuspid valve.  The aortic valve area was about a 0.5  cm2.  There is no significant mitral regurgitation.  There is moderate  concentric left  ventricular hypertrophy.  The right ventricle appeared  normal.   Then the chest, abdomen and both lower extremities were prepped and  draped usual sterile manner.  The chest was entered through a median  sternotomy incision.  The pericardium opened in the midline.  Examination heart showed good ventricular contractility.  The ascending  aorta was normal size for her body surface area but appeared somewhat  small for her body mass.  There were no palpable plaques in the aorta.   Then the patient was heparinized, when an adequate activated clotting  time was achieved, the distal ascending aorta was cannulated using a 20-  [place] aortic cannula for arterial inflow.  Venous outflow was achieved  using a two-stage venous cannula through the right atrial appendage.  An  antegrade cardioplegia and vent cannula was inserted in aortic root.   The patient placed on cardiopulmonary bypass.  A left ventricular vent  was placed through the right superior pulmonary vein and retrograde  cardioplegia cannula through the right atrium into the coronary sinus.  Then the aorta was crossclamped and 1000 mL of cold blood antegrade  cardioplegia was administered in the aortic root with quick arrest the  heart.  Systemic hypothermia to 28 degrees centigrade and topical  hypothermia with iced saline was used.  Temperature probe placed in the  septum, insulating pad in the pericardium.  Additional doses of  retrograde cardioplegia were given about 20-minute intervals to maintain  myocardial temperature around 10 degrees centigrade.   The aorta was opened transversely at the level of sinotubular junction.  Examination the native valve showed that was a true bicuspid valve with  the coronary ostia lying at about 180 degrees from each other.  They  were lying fairly high up off the aortic annulus.  The native valve was excised.  The annulus was decalcified with rongeurs.  Care was taken to  remove all  particulate debris.  Left ventricle and aortic root were  irrigated with iced saline solution.  I did not feel that a stentless  valve would be suitable for this circumstance since the coronary  arteries were essentially lying 180 degrees apart from each other and  may become obstructed by the commissural posts.  The annulus was sized  and surprisingly a 23-mm Edwards pericardial Magna valve fit quite well.  The valve was a little tight going through the aorta but fit the annulus  nicely.  This had model number 3000, serial number [id number].  Her  ascending aorta was somewhat thin-walled.   Then a series of pledgeted 2-0 Ethibond horizontal mattress sutures  placed around the aortic annulus with pledgets in the subannular  position.  The sutures were placed through the sewing ring of valve  lowered in place.  Sutures were tied sequentially.  The right and left  coronary ostia were not obstructed.   Then the aorta was closed in two layers using continuous 4-0 Prolene  suture with thin felt strips to reinforce the aortic closure, since  aorta was thin.  Then the left side of the heart was de-aired.  The head  was placed in Trendelenburg position and the crossclamp removed with a  time of 88 minutes.  There is spontaneous return of ventricular  fibrillation.  The patient was defibrillated to sinus rhythm.  The  aortotomy appeared hemostatic.  Two temporary right ventricular and  right atrial pacing wires placed and brought out through the skin.   After further de-airing maneuvers and when the patient was rewarmed to  37 degrees centigrade, she was weaned from cardiopulmonary bypass on no  inotropic agents.  Total bypass time was 120 minutes.  Transesophageal  echocardiogram was performed.  This showed normal functioning aortic  valve prosthesis with no regurgitation or perivalvular leak.  There was  no significant mitral regurgitation.  Left ventricular function appeared  well-preserved.   Protamine was then given and venous and aortic cannulas  removed without difficulty.  The left ventricular vent and retrograde  cardioplegia cannula had been removed already.  Hemostasis was achieved.  Three chest tubes were placed with a tube in the post pericardium, one  in the anterior mediastinum and one in the right pleural space which had  been opened when the sternum was opened.  The sternum was then closed  with #6 stainless steel wires.  Fascia was closed with continuous #1  Vicryl suture.  Subcutaneous tissue was closed with continuous 2-0  Vicryl and skin with 3-0 Vicryl subcuticular closure.  Sponge, needles  and counts were correct according to scrub nurse.  Dry sterile dressing  applied over the incisions around the chest tubes which were hooked to  Pleur-Evac suction.  The patient remained hemodynamically stable,  transferred to the SICU in guarded but stable condition.     Evelene Croon, M.D.  Electronically Signed     BB/MEDQ  D:  12/10/2006  T:  12/10/2006  Job:  [id number]   cc:   Elmore Guise., M.D.

## 2011-02-28 ENCOUNTER — Ambulatory Visit (HOSPITAL_COMMUNITY)
Admission: RE | Admit: 2011-02-28 | Discharge: 2011-02-28 | Disposition: A | Discharge status: Home / Self Care | Payer: BC Managed Care – PPO | Source: Ambulatory Visit | Attending: Gastroenterology | Admitting: Gastroenterology

## 2011-02-28 ENCOUNTER — Ambulatory Visit (HOSPITAL_COMMUNITY): Payer: BC Managed Care – PPO

## 2011-02-28 DIAGNOSIS — K222 Esophageal obstruction: Secondary | ICD-10-CM | POA: Insufficient documentation

## 2011-02-28 DIAGNOSIS — Z79899 Other long term (current) drug therapy: Secondary | ICD-10-CM | POA: Insufficient documentation

## 2011-02-28 DIAGNOSIS — Z87891 Personal history of nicotine dependence: Secondary | ICD-10-CM | POA: Insufficient documentation

## 2011-02-28 DIAGNOSIS — R1310 Dysphagia, unspecified: Secondary | ICD-10-CM | POA: Insufficient documentation

## 2011-02-28 DIAGNOSIS — E109 Type 1 diabetes mellitus without complications: Secondary | ICD-10-CM | POA: Insufficient documentation

## 2011-02-28 DIAGNOSIS — K209 Esophagitis, unspecified without bleeding: Secondary | ICD-10-CM | POA: Insufficient documentation

## 2011-02-28 DIAGNOSIS — Z800 Family history of malignant neoplasm of digestive organs: Secondary | ICD-10-CM | POA: Insufficient documentation

## 2011-02-28 DIAGNOSIS — K449 Diaphragmatic hernia without obstruction or gangrene: Secondary | ICD-10-CM | POA: Insufficient documentation

## 2011-02-28 DIAGNOSIS — I10 Essential (primary) hypertension: Secondary | ICD-10-CM | POA: Insufficient documentation

## 2011-03-08 NOTE — Op Note (Signed)
Amy Russo, Amy Russo               ACCOUNT NO.:  [id number]  MEDICAL RECORD NO.:  [id number]  LOCATION:  WLEN                         FACILITY:  [facility]  PHYSICIAN:  Bernette Redbird, M.D.   DATE OF BIRTH:  13-Apr-1951  DATE OF PROCEDURE:  02/28/2011 DATE OF DISCHARGE:                              OPERATIVE REPORT   PROCEDURE:  Upper endoscopy with Savary dilatation of the esophagus under fluoroscopy.  INDICATIONS:  A 60 year old female 2 years status post esophageal dilatation of a Schatzki's ring, now with recurrent dysphagia symptoms characterized by recurrent transient food impactions.  FINDINGS:  Distal erosive esophagitis.  Moderate esophageal ring dilated to 18 mm by Savary technique.  PROCEDURE:  The nature, purpose, risks of the procedure were familiar to the patient from prior examination and she provided written consent. She came as an outpatient to the [facility].  Sedation was propofol by Anesthesia.  The Pentax video endoscope was passed under direct vision.  The esophagus was readily entered.  The vocal cords were not well seen.  The proximal esophagus was normal but distally there was some inflammatory change characterized by tongues of erythematous mucosa with overlying desquamating exudate.  No frank ulcerations were noted.  There was a moderate esophageal ring consistent with a Schatzki's ring in the region of the squamocolumnar junction.  The scope passed easily through this area, below which was a small hiatal hernia.  The stomach contained no significant residual and did not have any significant gastritis or erosions, ulcers, polyps or masses including a retroflex view of the cardia.  The pylorus, duodenal bulb and second duodenum looked normal.  Savary dilatation was then performed in the standard fashion.  The spring tip guidewire was passed through the scope into the antrum of the stomach and the scope was removed in an exchange fashion  after which fluoroscopic confirmation of appropriate positioning of the wire was achieved and passage was made with a 16-mm Savary dilator over the guidewire, confirming fluoroscopically that the widest portion of the dilator had reached below the level of the diaphragm.  The dilator and guidewire were removed and the patient was reendoscoped under direct vision.  This inspection showed fracture of the ring with a small amount of fresh hemorrhage.  There was no evidence of any deep tear and it was felt that it was probably prudent and appropriate to advance the same size that she had previously been dilated to, when she had it done 2 years ago.  Thus, the spring tip guidewire was reinserted, the scope was removed in an exchange fashion, and another passage was made, this time using an 18-mm Savary dilator under fluoroscopic guidance.  The widest portion again appeared to reach below the level of diaphragm.  The dilator and guidewire were removed and the patient was reinspected which showed mucosal disruption in the region of the ring, with fracture of the ring, but no evidence of deep tears, perforation, or undue trauma. There was a little bit of a skid mark in the proximal esophagus in the cervical esophageal region, but again, no deep tear.  The scope was removed from the patient.  No biopsies were performed. She tolerated  the procedure well and there were no apparent complications.  IMPRESSION: 1. Distal erosive esophagitis consistent with reflux. 2. Small hiatal hernia. 3. Esophageal ring, dilated to 18 mm by Savary technique as described     above.  PLAN: 1. Increase omeprazole to 20 mg on a regular daily basis rather than     using it on occasional and p.r.n. basis as she has been doing up to     now. 2. Clinical followup of dysphagia symptoms with repeat dilatation if     and when dysphagia symptoms recur.          ______________________________ Bernette Redbird,  M.D.     RB/MEDQ  D:  02/28/2011  T:  02/28/2011  Job:  [id number]  cc:   Geoffry Paradise, M.D. Fax: [phone]  Electronically Signed by Bernette Redbird M.D. on 03/08/2011 12:38:59 PM

## 2011-08-22 ENCOUNTER — Telehealth: Payer: Self-pay | Admitting: Cardiology

## 2011-08-22 NOTE — Telephone Encounter (Signed)
New msg Pt was calling saying she was pt of Dr Reyes Ivan and she said she needs to see Dr Sw[place]. She was upset that she didn't receive a notice that office had moved. She thinks she saw Dr Swaziland about 1 year ago. She wanted to talk to you. She wasn't very nice.

## 2011-08-22 NOTE — Telephone Encounter (Signed)
Spoke w/Mrs. Amy Russo. She was upset that she never received notice that we had moved. Found her old chart and was last seen 10/2009. Advised her she had an app last year but missed the app. Rescheduled to see Dr. Swaziland 1/25. She states she is not having any problems but wanted to get reestablished.

## 2011-09-27 ENCOUNTER — Ambulatory Visit (INDEPENDENT_AMBULATORY_CARE_PROVIDER_SITE_OTHER): Payer: BC Managed Care – PPO | Admitting: Cardiology

## 2011-09-27 ENCOUNTER — Encounter: Payer: Self-pay | Admitting: Cardiology

## 2011-09-27 VITALS — BP 144/70 | HR 75 | Ht 60.0 in | Wt 193.3 lb

## 2011-09-27 DIAGNOSIS — I10 Essential (primary) hypertension: Secondary | ICD-10-CM

## 2011-09-27 DIAGNOSIS — E119 Type 2 diabetes mellitus without complications: Secondary | ICD-10-CM | POA: Insufficient documentation

## 2011-09-27 DIAGNOSIS — Z954 Presence of other heart-valve replacement: Secondary | ICD-10-CM

## 2011-09-27 DIAGNOSIS — Z952 Presence of prosthetic heart valve: Secondary | ICD-10-CM

## 2011-09-27 DIAGNOSIS — I359 Nonrheumatic aortic valve disorder, unspecified: Secondary | ICD-10-CM | POA: Insufficient documentation

## 2011-09-27 DIAGNOSIS — E669 Obesity, unspecified: Secondary | ICD-10-CM

## 2011-09-27 DIAGNOSIS — IMO0001 Reserved for inherently not codable concepts without codable children: Secondary | ICD-10-CM

## 2011-09-27 NOTE — Assessment & Plan Note (Signed)
She reports poor sugar control but is noncompliant with dietary recommendations. I've encouraged her to comply with her diet and lose weight. She needs to increase her aerobic activity.

## 2011-09-27 NOTE — Progress Notes (Signed)
   Ralene Muskrat Date of Birth: 1951/05/05 Medical Record #[id number]  History of Present Illness: Amy Russo is seen today for followup of her aortic valve disease. 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 This was a #23 mm Edwards magna pericardial valve. She was last seen here in February 2011. She denies any significant cardiac complaints. She specifically denies any chest pain, shortness of breath, or palpitations. Her last evaluation with echocardiogram in August of 2010 showed normal prosthetic valve function.  Current Outpatient Prescriptions on File Prior to Visit  Medication Sig Dispense Refill  . lisinopril (PRINIVIL,ZESTRIL) 20 MG tablet Take 1 tablet (20 mg total) by mouth daily.  30 tablet  5    Allergies  Allergen Reactions  . Epinephrine Palpitations and Other (See Comments)    Rapid heart beat    Past Medical History  Diagnosis Date  . Aortic valve disease   . Esophageal stricture   . Diabetes mellitus   . HTN (hypertension)   . Obesity   . Hypothyroidism     Past Surgical History  Procedure Date  . Avr 12/2006    #23 Edwards Magna pericardial valve  . Trigger finger release   . Tubal ligation   . Tonsillectomy   . Axillary artery aneurysm repair     left    History  Smoking status  . Former Smoker  . Quit date: 09/26/1985  Smokeless tobacco  . Not on file    History  Alcohol Use: Not on file    History reviewed. No pertinent family history.  Review of Systems: The review of systems is positive for diabetes that has been poorly controlled. She admits that she hasn't been eating appropriately. She has had recurrent esophageal stricture and has had 2 esophageal dilatations but she continues to experience significant dysphasia.  All other systems were reviewed and are negative.  Physical Exam: BP 144/70  Pulse 75  Ht 5' (1.524 m)  Wt 193 lb 4.8 oz (87.68 kg)  BMI 37.75 kg/m2 She is  an obese white female in no acute distress.The patient is alert and oriented x 3.  The mood and affect are normal.  The skin is warm and dry.  Color is normal.  The HEENT exam reveals that the sclera are nonicteric.  The mucous membranes are moist.  The carotids are 2+ without bruits.  There is no thyromegaly.  There is no JVD.  The lungs are clear.  The chest wall is non tender.  The heart exam reveals a regular rate with a normal S1 and S2.  There are no murmurs, gallops, or rubs.  The PMI is not displaced.   Abdominal exam reveals good bowel sounds.  There is no guarding or rebound.  There is no hepatosplenomegaly or tenderness.  There are no masses.  Exam of the legs reveal no clubbing, cyanosis, or edema.  The legs are without rashes.  The distal pulses are intact.  Cranial nerves II - XII are intact.  Motor and sensory functions are intact.  The gait is normal.  LABORATORY DATA: ECG today demonstrates normal sinus rhythm with a nonspecific T-wave abnormality.  Assessment / Plan:

## 2011-09-27 NOTE — Assessment & Plan Note (Signed)
She is asymptomatic from a cardiac standpoint. Her valve sounds normal on exam today. Her last echocardiogram in 2010 was unremarkable. Continue followup on a yearly basis.

## 2011-09-27 NOTE — Patient Instructions (Signed)
Continue your current therapy  I will see you again in 1 year.   

## 2012-02-10 ENCOUNTER — Ambulatory Visit: Payer: Self-pay

## 2012-05-01 DIAGNOSIS — E113299 Type 2 diabetes mellitus with mild nonproliferative diabetic retinopathy without macular edema, unspecified eye: Secondary | ICD-10-CM | POA: Insufficient documentation

## 2012-09-25 ENCOUNTER — Other Ambulatory Visit: Payer: Self-pay | Admitting: Gastroenterology

## 2012-10-29 ENCOUNTER — Encounter: Payer: Self-pay | Admitting: Cardiology

## 2012-10-29 ENCOUNTER — Ambulatory Visit (INDEPENDENT_AMBULATORY_CARE_PROVIDER_SITE_OTHER): Payer: BC Managed Care – PPO | Admitting: Cardiology

## 2012-10-29 VITALS — BP 136/80 | HR 93 | Ht 60.0 in | Wt 168.1 lb

## 2012-10-29 DIAGNOSIS — Z952 Presence of prosthetic heart valve: Secondary | ICD-10-CM

## 2012-10-29 DIAGNOSIS — I359 Nonrheumatic aortic valve disorder, unspecified: Secondary | ICD-10-CM

## 2012-10-29 DIAGNOSIS — I10 Essential (primary) hypertension: Secondary | ICD-10-CM

## 2012-10-29 DIAGNOSIS — Z954 Presence of other heart-valve replacement: Secondary | ICD-10-CM

## 2012-10-29 NOTE — Progress Notes (Signed)
Amy Russo Date of Birth: 1951-07-29 Medical Record #[id number]  History of Present Illness: Amy Russo is seen today for followup of her aortic valve disease. 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 This was a #23 mm Edwards magna pericardial valve. Her last  echocardiogram in August of 2010 showed normal prosthetic valve function. She denies any symptoms of chest pain, shortness of breath, or palpitations. Reports her diabetes has been under control. She is on Qysmia for weight loss. She admits that she is sedentary.  Current Outpatient Prescriptions on File Prior to Visit  Medication Sig Dispense Refill  . aspirin 81 MG tablet Take 160 mg by mouth daily.      . Calcium Carbonate (CALTRATE 600 PO) Take by mouth daily.      . Cyanocobalamin (VITAMIN B-12 PO) Take by mouth daily.      . Glucosamine 500 MG CAPS Take by mouth daily.      Marland Kitchen GLUCOSAMINE-CHONDROITIN PO Take by mouth daily.      . Insulin Aspart (NOVOLOG [place]) Inject into the skin. 2-6 units as needed      . Insulin Glargine (LANTUS [place]) Inject 35 Units into the skin.      Marland Kitchen levothyroxine (SYNTHROID, LEVOTHROID) 88 MCG tablet Take 88 mcg by mouth daily.      Marland Kitchen lisinopril (PRINIVIL,ZESTRIL) 20 MG tablet Take 1 tablet (20 mg total) by mouth daily.  30 tablet  5  . Multiple Vitamin (MULTI-VITAMIN PO) Take by mouth daily.      . Omega-3 Fatty Acids (FISH OIL PO) Take by mouth daily.      . Pyridoxine HCl (VITAMIN B-6 PO) Take 100 mg by mouth.       No current facility-administered medications on file prior to visit.    Allergies  Allergen Reactions  . Epinephrine Palpitations and Other (See Comments)    Rapid heart beat    Past Medical History  Diagnosis Date  . Aortic valve disease   . Esophageal stricture   . Diabetes mellitus   . HTN (hypertension)   . Obesity   . Hypothyroidism     Past Surgical History  Procedure Laterality Date  . Avr  12/2006   #23 Edwards Magna pericardial valve  . Trigger finger release    . Tubal ligation    . Tonsillectomy    . Axillary artery aneurysm repair      left    History  Smoking status  . Former Smoker  . Quit date: 09/26/1985  Smokeless tobacco  . Not on file    History  Alcohol Use: Not on file    History reviewed. No pertinent family history.  Review of Systems: As noted in history of present illness. All other systems were reviewed and are negative.  Physical Exam: BP 136/80  Pulse 93  Ht 5' (1.524 m)  Wt 168 lb 1.9 oz (76.259 kg)  BMI 32.83 kg/m2  SpO2 98% She is an obese white female in no acute distress.  The skin is warm and dry.  Color is normal.  The HEENT exam is unremarkable.  The carotids are 2+ without bruits.  There is no thyromegaly.  There is no JVD.  The lungs are clear.   The heart exam reveals a regular rate with a normal S1 and S2.  There are no murmurs, gallops, or rubs.  The PMI is not displaced.   Abdominal exam reveals good bowel sounds.  There is no guarding or rebound.  There is no hepatosplenomegaly or tenderness.  There are no masses.  Exam of the legs reveal no clubbing, cyanosis, or edema.  The legs are without rashes.  The distal pulses are intact.  Cranial nerves II - XII are intact. Alert and oriented x3. Motor and sensory functions are intact.  The gait is normal.  LABORATORY DATA: ECG today demonstrates normal sinus rhythm with a nonspecific T-wave abnormality.  Assessment / Plan: 1. Status post bioprosthetic aortic valve replacement with stable valve function by exam. Would consider followup echocardiogram in one year. I've encouraged her to increase her aerobic activity. I'll followup in one year  2. Diabetes mellitus.

## 2012-10-29 NOTE — Patient Instructions (Signed)
Continue your current therapy  I will see you in one year   

## 2012-11-19 ENCOUNTER — Other Ambulatory Visit: Payer: Self-pay | Admitting: Obstetrics and Gynecology

## 2012-11-19 DIAGNOSIS — R928 Other abnormal and inconclusive findings on diagnostic imaging of breast: Secondary | ICD-10-CM

## 2012-11-30 ENCOUNTER — Ambulatory Visit
Admission: RE | Admit: 2012-11-30 | Discharge: 2012-11-30 | Disposition: A | Discharge status: Home / Self Care | Payer: BC Managed Care – PPO | Source: Ambulatory Visit | Attending: Obstetrics and Gynecology | Admitting: Obstetrics and Gynecology

## 2012-11-30 DIAGNOSIS — R928 Other abnormal and inconclusive findings on diagnostic imaging of breast: Secondary | ICD-10-CM

## 2012-12-25 ENCOUNTER — Telehealth: Payer: Self-pay | Admitting: Cardiology

## 2012-12-25 NOTE — Telephone Encounter (Signed)
Received request from Nurse, documents faxed for surgical clearance on 12/25/12 rmf. To: [facility] Fax number: [phone]

## 2013-02-16 DIAGNOSIS — H35372 Puckering of macula, left eye: Secondary | ICD-10-CM | POA: Insufficient documentation

## 2013-02-18 ENCOUNTER — Encounter (INDEPENDENT_AMBULATORY_CARE_PROVIDER_SITE_OTHER): Payer: BC Managed Care – PPO | Admitting: *Deleted

## 2013-02-18 DIAGNOSIS — M7989 Other specified soft tissue disorders: Secondary | ICD-10-CM

## 2013-04-07 ENCOUNTER — Other Ambulatory Visit: Payer: Self-pay | Admitting: Gastroenterology

## 2013-06-29 ENCOUNTER — Ambulatory Visit (INDEPENDENT_AMBULATORY_CARE_PROVIDER_SITE_OTHER): Payer: BC Managed Care – PPO

## 2013-06-29 VITALS — BP 115/65 | HR 76 | Resp 20 | Ht 61.5 in | Wt 172.0 lb

## 2013-06-29 DIAGNOSIS — E1149 Type 2 diabetes mellitus with other diabetic neurological complication: Secondary | ICD-10-CM

## 2013-06-29 DIAGNOSIS — M2010 Hallux valgus (acquired), unspecified foot: Secondary | ICD-10-CM

## 2013-06-29 DIAGNOSIS — E1142 Type 2 diabetes mellitus with diabetic polyneuropathy: Secondary | ICD-10-CM

## 2013-06-29 DIAGNOSIS — Q828 Other specified congenital malformations of skin: Secondary | ICD-10-CM

## 2013-06-29 DIAGNOSIS — E1140 Type 2 diabetes mellitus with diabetic neuropathy, unspecified: Secondary | ICD-10-CM

## 2013-06-29 NOTE — Progress Notes (Signed)
  Subjective:    Patient ID: Amy Russo, female    DOB: 10/10/1950, 62 y.o.   MRN: [id number] "I'm Diabetic so I'm here for a check up.  I have some numbing pain in my toes."   Diabetes She presents for her initial diabetic visit. She has type 2 diabetes mellitus. Her disease course has been fluctuating. Associated symptoms include foot paresthesias. Symptoms are stable. Current diabetic treatment includes insulin injections. She sees a podiatrist.  patient does have diabetes it follows up with Dr. Marius Ditch for her diabetic care. How redness she is not following your diet last A1c was 10.2.    Review of Systems  Constitutional: Negative.   HENT: Positive for hearing loss and tinnitus.   Eyes: Negative.   Respiratory: Negative.   Cardiovascular: Negative.   Gastrointestinal: Positive for constipation.  Endocrine: Negative.   Genitourinary: Negative.   Musculoskeletal: Positive for arthralgias, back pain and joint swelling.  Skin: Negative.   Allergic/Immunologic: Negative.   Neurological: Negative.   Hematological: Negative.   Psychiatric/Behavioral: Positive for sleep disturbance.       Objective:   Physical Exam  Vitals reviewed. Constitutional: She is oriented to person, place, and time. She appears well-developed and well-nourished.  Cardiovascular:  Pulses:      Dorsalis pedis pulses are 1+ on the right side, and 1+ on the left side.       Posterior tibial pulses are 1+ on the left side.  Capillary refill time 3 seconds all digits bilateral skin temperature warm turgor somewhat diminished no edema rubor pallor mild varicosities noted  Musculoskeletal:  Biomechanical exam reveals a slight cavovarus type foot with moderate HAV deformity bilateral with hallux interphalangeus or malleus noted patient does have hammertoes 2 through 5 with a plantarflexed first metatarsal in somewhat of a plantarflexed fourth metatarsal with keratoses sub-14 bilateral  Neurological: She is  alert and oriented to person, place, and time. She has normal strength and normal reflexes.  Epicritic and proprioceptive sensations are diminished on Semmes Weinstein testing bilateral to the toes sub-first and inferior heel areas bilateral has intact sensation sub-fifth MTP area bilateral  Skin: Skin is warm. No cyanosis. Nails show no clubbing.  Skin color pigment normal hair growth diminished absent bilateral nail somewhat criptotic otherwise unremarkable. Patient generally have some dry scaling skin  Psychiatric: She has a normal mood and affect. Her behavior is normal. Thought content normal.          Assessment & Plan:  Diabetes with poor control, by patient's own admission. Patient does have peripheral neuropathy with decreased epicritic sensation confirmed on [facility] testing. Patient is doing her own palliative care nails and calluses and using lotion daily. Patient is wearing appropriate shoes and socks as recommended. Literature and diabetic foot care is dispensed. Recommended in annual followup and check. Did suggest better diabetic control in hopes of preventing progression of her neuropathy. Suggested a multivitamin in particular B6 B12 and folic acid components.  Alvan Dame DPM

## 2013-06-29 NOTE — Patient Instructions (Signed)

## 2013-09-07 ENCOUNTER — Telehealth: Payer: Self-pay | Admitting: Cardiology

## 2013-09-07 DIAGNOSIS — I359 Nonrheumatic aortic valve disorder, unspecified: Secondary | ICD-10-CM

## 2013-09-07 DIAGNOSIS — Z952 Presence of prosthetic heart valve: Secondary | ICD-10-CM

## 2013-09-07 NOTE — Telephone Encounter (Signed)
Yes she is due for a follow up Echo. We can schedule this before her OV.  Peter Martinique MD, [facility]

## 2013-09-07 NOTE — Telephone Encounter (Signed)
New Problem:  Pt states she believes Dr. Martinique told her she needed some test at her next appt... Pt does not have any ordered for labs or specialty test. Pt would like a call back from the nurse for clarification.

## 2013-09-07 NOTE — Telephone Encounter (Signed)
Pt is calling regarding the test. Pt  think Dr. Martinique mention about a test that she needs to have  prior her yearly OV. Pt is aware that Dr. Martinique  on the last OV noted  "Would consider a followup echocardiogram in one year" Pt is aware that I will send this message to MD and nurse for reviewing.

## 2013-09-08 NOTE — Addendum Note (Signed)
Addended by: Golden Hurter D on: 09/08/2013 11:23 AM   Modules accepted: Orders

## 2013-09-08 NOTE — Telephone Encounter (Signed)
Returned call to patient no answer.LMTC. 

## 2013-09-08 NOTE — Telephone Encounter (Signed)
Received call from patient Dr.Jordan advised to have follow up echo before office visit.

## 2013-09-15 DIAGNOSIS — E11311 Type 2 diabetes mellitus with unspecified diabetic retinopathy with macular edema: Secondary | ICD-10-CM | POA: Insufficient documentation

## 2013-09-23 ENCOUNTER — Ambulatory Visit (HOSPITAL_COMMUNITY): Payer: BC Managed Care – PPO | Attending: Cardiology | Admitting: Radiology

## 2013-09-23 DIAGNOSIS — I359 Nonrheumatic aortic valve disorder, unspecified: Secondary | ICD-10-CM

## 2013-09-23 DIAGNOSIS — Z954 Presence of other heart-valve replacement: Secondary | ICD-10-CM | POA: Insufficient documentation

## 2013-09-23 DIAGNOSIS — I079 Rheumatic tricuspid valve disease, unspecified: Secondary | ICD-10-CM | POA: Insufficient documentation

## 2013-09-23 DIAGNOSIS — Z952 Presence of prosthetic heart valve: Secondary | ICD-10-CM

## 2013-09-23 DIAGNOSIS — I059 Rheumatic mitral valve disease, unspecified: Secondary | ICD-10-CM | POA: Insufficient documentation

## 2013-09-23 DIAGNOSIS — E119 Type 2 diabetes mellitus without complications: Secondary | ICD-10-CM | POA: Insufficient documentation

## 2013-09-23 DIAGNOSIS — I10 Essential (primary) hypertension: Secondary | ICD-10-CM | POA: Insufficient documentation

## 2013-09-23 DIAGNOSIS — E669 Obesity, unspecified: Secondary | ICD-10-CM | POA: Insufficient documentation

## 2013-09-23 NOTE — Progress Notes (Signed)
Echocardiogram performed.  

## 2013-10-08 ENCOUNTER — Telehealth: Payer: Self-pay | Admitting: Cardiology

## 2013-10-08 NOTE — Telephone Encounter (Signed)
New message          Pt has a question about antibiotic.

## 2013-10-08 NOTE — Telephone Encounter (Signed)
Returned call to patient she stated she was presently taking a antibiotic for a UTI.Stated she will be having dental work next Friday and wanted to know if she will need the antibiotic she normally takes before dental work.Advised she will need to take her antibiotic that she normally takes 1 hour before dental work.

## 2013-10-08 NOTE — Telephone Encounter (Signed)
Returned call to patient no answer.LMTC. 

## 2013-10-08 NOTE — Telephone Encounter (Signed)
Follow up    Returned Amy Russo's call

## 2013-11-25 ENCOUNTER — Encounter: Payer: Self-pay | Admitting: Cardiology

## 2013-11-25 ENCOUNTER — Ambulatory Visit (INDEPENDENT_AMBULATORY_CARE_PROVIDER_SITE_OTHER): Payer: BC Managed Care – PPO | Admitting: Cardiology

## 2013-11-25 VITALS — BP 140/78 | HR 80 | Ht 61.5 in | Wt 175.6 lb

## 2013-11-25 DIAGNOSIS — I359 Nonrheumatic aortic valve disorder, unspecified: Secondary | ICD-10-CM

## 2013-11-25 DIAGNOSIS — I10 Essential (primary) hypertension: Secondary | ICD-10-CM

## 2013-11-25 DIAGNOSIS — E669 Obesity, unspecified: Secondary | ICD-10-CM

## 2013-11-25 DIAGNOSIS — Z952 Presence of prosthetic heart valve: Secondary | ICD-10-CM

## 2013-11-25 DIAGNOSIS — Z954 Presence of other heart-valve replacement: Secondary | ICD-10-CM

## 2013-11-25 NOTE — Progress Notes (Signed)
Amy Russo Date of Birth: 1951-08-27 Medical Record #[id number]  History of Present Illness: Amy Russo is seen today for followup of her aortic valve disease. 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 This was a #23 mm Edwards magna pericardial valve. Her last  echocardiogram in Jan 2015 showed normal prosthetic valve function. She denies any symptoms of chest pain, shortness of breath, or palpitations. She has gained some weight this year. She has had injections for carpel tunnel syndrome.   Current Outpatient Prescriptions on File Prior to Visit  Medication Sig Dispense Refill  . aspirin 81 MG tablet Take 81 mg by mouth daily.       . Cyanocobalamin (VITAMIN B-12 PO) Take 1,000 mg by mouth daily.       . Insulin Aspart (NOVOLOG [place]) Inject into the skin. 2-6 units as needed      . Insulin Glargine (LANTUS [place]) Inject 35 Units into the skin at bedtime.       Marland Kitchen levothyroxine (SYNTHROID, LEVOTHROID) 88 MCG tablet Take 75 mcg by mouth daily.       Marland Kitchen lisinopril (PRINIVIL,ZESTRIL) 20 MG tablet Take 1 tablet (20 mg total) by mouth daily.  30 tablet  5  . Multiple Vitamin (MULTI-VITAMIN PO) Take by mouth daily.      . Omega-3 Fatty Acids (FISH OIL PO) Take 1,200 mg by mouth daily.       Marland Kitchen omeprazole (PRILOSEC) 40 MG capsule Take 40 mg by mouth 2 (two) times daily.       . ONE TOUCH ULTRA TEST test strip       . Pyridoxine HCl (VITAMIN B-6 PO) Take 100 mg by mouth 2 (two) times daily.       Marland Kitchen QSYMIA 11.25-69 MG CP24 daily.        No current facility-administered medications on file prior to visit.    Allergies  Allergen Reactions  . Epinephrine Palpitations and Other (See Comments)    Rapid heart beat    Past Medical History  Diagnosis Date  . Aortic valve disease   . Esophageal stricture   . Diabetes mellitus   . HTN (hypertension)   . Obesity   . Hypothyroidism   . Barrett's esophagus     Past Surgical History   Procedure Laterality Date  . Avr  12/2006    #23 Edwards Magna pericardial valve  . Trigger finger release    . Tubal ligation    . Tonsillectomy    . Axillary artery aneurysm repair      left  . Carpal tunnel release Left     History  Smoking status  . Former Smoker  . Quit date: 09/26/1985  Smokeless tobacco  . Not on file    History  Alcohol Use  . 0.0 oz/week    Comment: ocassionally    History reviewed. No pertinent family history.  Review of Systems: As noted in history of present illness. All other systems were reviewed and are negative.  Physical Exam: BP 140/78  Pulse 80  Ht 5' 1.5" (1.562 m)  Wt 175 lb 9.6 oz (79.652 kg)  BMI 32.65 kg/m2 She is an obese white female in no acute distress.  The skin is warm and dry.  Color is normal.  The HEENT exam is unremarkable.  The carotids are 2+ without bruits.  There is no thyromegaly.  There is no JVD.  The lungs are clear.   The  heart exam reveals a regular rate with a normal S1 and S2.  There are no murmurs, gallops, or rubs.  The PMI is not displaced.   Abdominal exam reveals good bowel sounds.  There is no guarding or rebound.  There is no hepatosplenomegaly or tenderness.  There are no masses.  Exam of the legs reveal no clubbing, cyanosis, or edema.  The legs are without rashes.  The distal pulses are intact.  Cranial nerves II - XII are intact. Alert and oriented x3. Motor and sensory functions are intact.  The gait is normal.  LABORATORY DATA: ECG today demonstrates normal sinus rhythm with a nonspecific T-wave abnormality.  Echo:Study Conclusions  - Left ventricle: The cavity size was normal. There was mild concentric hypertrophy. Systolic function was normal. The estimated ejection fraction was in the range of 60% to 65%. Wall motion was normal; there were no regional wall motion abnormalities. Features are consistent with a pseudonormal left ventricular filling pattern, with concomitant abnormal  relaxation and increased filling pressure (grade 2 diastolic dysfunction). - Ventricular septum: Septal motion showed paradox. - Aortic valve: A bioprosthesis was present and functioning normally. - Mitral valve: Calcified annulus. - Left atrium: The atrium was mildly dilated. - Tricuspid valve: Mild-moderate regurgitation directed centrally.   Assessment / Plan: 1. Status post bioprosthetic aortic valve replacement with stable valve function by exam and Echo. I've encouraged her to increase her aerobic activity. Try and lose weight. I'll followup in one year  2. Diabetes mellitus.

## 2013-11-25 NOTE — Patient Instructions (Signed)
Continue your current therapy  I will see you in one year   

## 2014-02-17 ENCOUNTER — Ambulatory Visit (INDEPENDENT_AMBULATORY_CARE_PROVIDER_SITE_OTHER): Payer: BC Managed Care – PPO | Admitting: Podiatrist

## 2014-02-17 ENCOUNTER — Ambulatory Visit: Payer: BC Managed Care – PPO | Admitting: Podiatrist

## 2014-02-17 DIAGNOSIS — IMO0002 Reserved for concepts with insufficient information to code with codable children: Secondary | ICD-10-CM

## 2014-02-17 DIAGNOSIS — M79606 Pain in leg, unspecified: Secondary | ICD-10-CM

## 2014-02-17 DIAGNOSIS — E1142 Type 2 diabetes mellitus with diabetic polyneuropathy: Secondary | ICD-10-CM

## 2014-02-17 DIAGNOSIS — M79609 Pain in unspecified limb: Secondary | ICD-10-CM

## 2014-02-17 DIAGNOSIS — B351 Tinea unguium: Secondary | ICD-10-CM

## 2014-02-17 DIAGNOSIS — E1165 Type 2 diabetes mellitus with hyperglycemia: Principal | ICD-10-CM

## 2014-02-17 DIAGNOSIS — E1149 Type 2 diabetes mellitus with other diabetic neurological complication: Secondary | ICD-10-CM

## 2014-02-17 DIAGNOSIS — E1140 Type 2 diabetes mellitus with diabetic neuropathy, unspecified: Secondary | ICD-10-CM

## 2014-02-17 DIAGNOSIS — Q828 Other specified congenital malformations of skin: Secondary | ICD-10-CM

## 2014-02-17 NOTE — Patient Instructions (Signed)
Diabetes and Foot Care Diabetes may cause you to have problems because of poor blood supply (circulation) to your feet and legs. This may cause the skin on your feet to become thinner, break easier, and heal more slowly. Your skin may become dry, and the skin may peel and crack. You may also have nerve damage in your legs and feet causing decreased feeling in them. You may not notice minor injuries to your feet that could lead to infections or more serious problems. Taking care of your feet is one of the most important things you can do for yourself.  HOME CARE INSTRUCTIONS  Wear shoes at all times, even in the house. Do not go barefoot. Bare feet are easily injured.  Check your feet daily for blisters, cuts, and redness. If you cannot see the bottom of your feet, use a mirror or ask someone for help.  Wash your feet with warm water (do not use hot water) and mild soap. Then pat your feet and the areas between your toes until they are completely dry. Do not soak your feet as this can dry your skin.  Apply a moisturizing lotion or petroleum jelly (that does not contain alcohol and is unscented) to the skin on your feet and to dry, brittle toenails. Do not apply lotion between your toes.  Trim your toenails straight across. Do not dig under them or around the cuticle. File the edges of your nails with an emery board or nail file.  Do not cut corns or calluses or try to remove them with medicine.  Wear clean socks or stockings every day. Make sure they are not too tight. Do not wear knee-high stockings since they may decrease blood flow to your legs.  Wear shoes that fit properly and have enough cushioning. To break in new shoes, wear them for just a few hours a day. This prevents you from injuring your feet. Always look in your shoes before you put them on to be sure there are no objects inside.  Do not cross your legs. This may decrease the blood flow to your feet.  If you find a minor scrape,  cut, or break in the skin on your feet, keep it and the skin around it clean and dry. These areas may be cleansed with mild soap and water. Do not cleanse the area with peroxide, alcohol, or iodine.  When you remove an adhesive bandage, be sure not to damage the skin around it.  If you have a wound, look at it several times a day to make sure it is healing.  Do not use heating pads or hot water bottles. They may burn your skin. If you have lost feeling in your feet or legs, you may not know it is happening until it is too late.  Make sure your health care provider performs a complete foot exam at least annually or more often if you have foot problems. Report any cuts, sores, or bruises to your health care provider immediately. SEEK MEDICAL CARE IF:   You have an injury that is not healing.  You have cuts or breaks in the skin.  You have an ingrown nail.  You notice redness on your legs or feet.  You feel burning or tingling in your legs or feet.  You have pain or cramps in your legs and feet.  Your legs or feet are numb.  Your feet always feel cold. SEEK IMMEDIATE MEDICAL CARE IF:   There is increasing redness,   swelling, or pain in or around a wound.  There is a red line that goes up your leg.  Pus is coming from a wound.  You develop a fever or as directed by your health care provider.  You notice a bad smell coming from an ulcer or wound. Document Released: 08/16/2000 Document Revised: 04/21/2013 Document Reviewed: 01/26/2013 ExitCare Patient Information 2015 [employer]. This information is not intended to replace advice given to you by your health care provider. Make sure you discuss any questions you have with your health care provider.  

## 2014-02-17 NOTE — Progress Notes (Signed)
   Chief Complaint  Patient presents with  . debridement    "Clipping and checking my toenails and callouses." 10 toenails, and callouses to B/L forefeet, and heels     HPI: Patient is 63 y.o. female who presents today for routine diabetic foot care.  She was recommended to me by her friends Jocelyn Lamer and Heron Nay.  She relates pain from her feet in ambulatoin and in shoes.      Physical Exam GENERAL APPEARANCE: Alert, conversant. Appropriately groomed. No acute distress.  VASCULAR: Pedal pulses palpable at 2/4 DP and PT bilateral.  Capillary refill time is immediate to all digits,  Proximal to distal temperature is warm to warm.  Digital hair growth is present bilateral  NEUROLOGIC: sensation is intact epicritically and protectively to 5.07 monofilament at 5/5 sites bilateral.  Light touch is intact bilateral, vibratory sensation intact bilateral, achilles tendon reflex is intact bilateral.  MUSCULOSKELETAL: high arched, cavus foot type is seen.  Prominent plantarflexed metatarsals are present bilateral.  DERMATOLOGIC: skin color, texture, and turger are within normal limits.  Calluses present bilateral forefeeet and heels which are painful.  Nails are mycotic and symptomatic.    Assessment:symptomatic mycotic toenails,  Cavus foot, hyperkeratotic skin lesions bilateral  Plan:debridement of nails and lesions carried out.  She will return as needed for follow up.

## 2014-07-04 ENCOUNTER — Other Ambulatory Visit: Payer: Self-pay | Admitting: Otolaryngology

## 2014-07-21 ENCOUNTER — Telehealth: Payer: Self-pay | Admitting: Cardiology

## 2014-07-21 NOTE — Telephone Encounter (Signed)
Returned call to patient Dr.Jordan advised ok to take Temazepam.

## 2014-07-21 NOTE — Telephone Encounter (Signed)
Returned call to patient she stated PCP prescribed Temazepam 15 mg at night for sleep.Stated she wanted to check with Dr.Jordan to make sure ok to take.Message sent to [place].

## 2014-07-21 NOTE — Telephone Encounter (Signed)
OK to take temazepam from my standpoint.  Jeannetta Cerutti Martinique MD, [facility]

## 2014-07-21 NOTE — Telephone Encounter (Signed)
Please call,pt says she need to ask you a question. She did not say what the question was.

## 2014-10-18 DIAGNOSIS — IMO0002 Reserved for concepts with insufficient information to code with codable children: Secondary | ICD-10-CM | POA: Insufficient documentation

## 2014-12-26 ENCOUNTER — Other Ambulatory Visit (HOSPITAL_COMMUNITY): Payer: Self-pay | Admitting: Internal Medicine

## 2014-12-26 ENCOUNTER — Ambulatory Visit (HOSPITAL_COMMUNITY)
Admission: RE | Admit: 2014-12-26 | Discharge: 2014-12-26 | Disposition: A | Discharge status: Home / Self Care | Payer: BLUE CROSS/BLUE SHIELD | Source: Ambulatory Visit | Attending: Vascular Surgery | Admitting: Vascular Surgery

## 2014-12-26 DIAGNOSIS — M7989 Other specified soft tissue disorders: Secondary | ICD-10-CM | POA: Insufficient documentation

## 2014-12-26 NOTE — Progress Notes (Signed)
Preliminary report phoned and given to [facility] at Dr. Jacquiline Doe office.

## 2014-12-28 ENCOUNTER — Telehealth: Payer: Self-pay | Admitting: Cardiology

## 2014-12-28 NOTE — Telephone Encounter (Signed)
Note faxed to Dr.Shoemaker at fax # [phone].

## 2014-12-28 NOTE — Telephone Encounter (Signed)
Message sent to [place] for clearance.

## 2014-12-28 NOTE — Telephone Encounter (Signed)
New message      Request for surgical clearance:  1. What type of surgery is being performed? cartilage removed from her ear  2. When is this surgery scheduled? tomorrow  3. Are there any medications that need to be held prior to surgery and how long? Pt want to make sure she will not need any medication prior to procedure  Name of physician performing surgery? Dr Wilburn Cornelia 4. What is your office phone and fax number? Phone# [phone]  5. I told pt we needed the doctor's office to call with this info---she stated Dr Wilburn Cornelia was out of the office and they did not seem to be concerned, but she wanted to know if she needed medication prior to procedure

## 2014-12-28 NOTE — Telephone Encounter (Signed)
She is OK for this surgery. No meds need to be held.  Sundae Maners Martinique MD, [facility]

## 2014-12-29 ENCOUNTER — Other Ambulatory Visit: Payer: Self-pay | Admitting: Otolaryngology

## 2015-01-10 ENCOUNTER — Ambulatory Visit (INDEPENDENT_AMBULATORY_CARE_PROVIDER_SITE_OTHER): Payer: BLUE CROSS/BLUE SHIELD | Admitting: Cardiology

## 2015-01-10 ENCOUNTER — Encounter: Payer: Self-pay | Admitting: Cardiology

## 2015-01-10 VITALS — BP 182/86 | HR 75 | Ht 61.5 in | Wt 190.6 lb

## 2015-01-10 DIAGNOSIS — Z952 Presence of prosthetic heart valve: Secondary | ICD-10-CM

## 2015-01-10 DIAGNOSIS — Z954 Presence of other heart-valve replacement: Secondary | ICD-10-CM

## 2015-01-10 DIAGNOSIS — I359 Nonrheumatic aortic valve disorder, unspecified: Secondary | ICD-10-CM | POA: Diagnosis not present

## 2015-01-10 DIAGNOSIS — I10 Essential (primary) hypertension: Secondary | ICD-10-CM | POA: Diagnosis not present

## 2015-01-10 NOTE — Patient Instructions (Signed)
Continue your current therapy  Restrict your salt intake  I will see you in one year

## 2015-01-10 NOTE — Progress Notes (Signed)
Amy Russo Date of Birth: November 01, 1950 Medical Record #[id number]  History of Present Illness: Amy Russo is seen today for followup of her aortic valve disease. 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 This was a #23 mm Edwards magna pericardial valve. Her last  echocardiogram in Jan 2015 showed normal prosthetic valve function. She denies any symptoms of chest pain, shortness of breath, or palpitations. She does note recent swelling in her ankles. Was seen by Dr. Reynaldo Minium. Dopplers showed no venous thrombus. She is on a statin now. Notes recent "family drama". BP high today but she reports good control at other doctor visits.   Current Outpatient Prescriptions on File Prior to Visit  Medication Sig Dispense Refill  . aspirin 81 MG tablet Take 81 mg by mouth daily.     . Calcium Carbonate-Vitamin D (CALCIUM + D PO) Take 600-800 mg by mouth daily.    . Cyanocobalamin (VITAMIN B-12 PO) Take 1,000 mg by mouth daily.     Marland Kitchen GLUCOSAMINE-CHONDROITIN PO Take by mouth. 1500-1288 mg Once a Day    . Insulin Aspart (NOVOLOG [place]) Inject into the skin. 2-6 units as needed    . Insulin Glargine (LANTUS [place]) Inject 35 Units into the skin at bedtime.     Marland Kitchen levothyroxine (SYNTHROID, LEVOTHROID) 88 MCG tablet Take 75 mcg by mouth daily.     . Multiple Vitamin (MULTI-VITAMIN PO) Take by mouth daily.    . nitrofurantoin, macrocrystal-monohydrate, (MACROBID) 100 MG capsule     . Omega-3 Fatty Acids (FISH OIL PO) Take 1,200 mg by mouth daily.     Marland Kitchen omeprazole (PRILOSEC) 40 MG capsule Take 20 mg by mouth 2 (two) times daily.     . ONE TOUCH ULTRA TEST test strip     . Pyridoxine HCl (VITAMIN B-6 PO) Take 100 mg by mouth 2 (two) times daily.     . traMADol (ULTRAM) 50 MG tablet as needed.    . TURMERIC PO Take by mouth daily.    Marland Kitchen lisinopril (PRINIVIL,ZESTRIL) 20 MG tablet Take 1 tablet (20 mg total) by mouth daily. 30 tablet 5   No current  facility-administered medications on file prior to visit.    Allergies  Allergen Reactions  . Epinephrine Palpitations and Other (See Comments)    Rapid heart beat    Past Medical History  Diagnosis Date  . Aortic valve disease   . Esophageal stricture   . Diabetes mellitus   . HTN (hypertension)   . Obesity   . Hypothyroidism   . Barrett's esophagus     Past Surgical History  Procedure Laterality Date  . Avr  12/2006    #23 Edwards Magna pericardial valve  . Trigger finger release    . Tubal ligation    . Tonsillectomy    . Axillary artery aneurysm repair      left  . Carpal tunnel release Left     History  Smoking status  . Former Smoker  . Quit date: 09/26/1985  Smokeless tobacco  . Not on file    History  Alcohol Use  . 0.0 oz/week    Comment: ocassionally    History reviewed. No pertinent family history.  Review of Systems: As noted in history of present illness. All other systems were reviewed and are negative.  Physical Exam: BP 182/86 mmHg  Pulse 75  Ht 5' 1.5" (1.562 m)  Wt 190 lb 9.6 oz (86.456 kg)  BMI 35.44 kg/m2 She is an obese white female in no acute distress.  The skin is warm and dry.  Color is normal.  The HEENT exam is unremarkable.  The carotids are 2+ without bruits.  There is no thyromegaly.  There is no JVD.  The lungs are clear.   The heart exam reveals a regular rate with a normal S1 and S2.  There are no murmurs, gallops, or rubs.  The PMI is not displaced.   Abdominal exam reveals good bowel sounds.  There is no guarding or rebound.  There is no hepatosplenomegaly or tenderness.  There are no masses.  Exam very mild ankle edema.  The legs are without rashes.  The distal pulses are intact.  Cranial nerves II - XII are intact. Alert and oriented x3. Motor and sensory functions are intact.  The gait is normal.  LABORATORY DATA: ECG today demonstrates normal sinus rhythm with a nonspecific ST-T-wave abnormality. I have personally  reviewed and interpreted this study.    Assessment / Plan: 1. Status post bioprosthetic aortic valve replacement with stable valve function by exam today and Echo one year ago. I've encouraged her to increase her aerobic activity. Try and lose weight. I'll followup in one year. Routine SBE prophylaxis.  2. Diabetes mellitus.  3. Mild ankle edema. Recommend conservative therapy with sodium restriction and elevation.   4. HTN. BP elevated today. I get 170/86. Will observe for now since other readings normal recently.

## 2015-02-02 ENCOUNTER — Other Ambulatory Visit: Payer: Self-pay | Admitting: Obstetrics and Gynecology

## 2015-02-03 LAB — CYTOLOGY - PAP

## 2015-02-15 ENCOUNTER — Ambulatory Visit (INDEPENDENT_AMBULATORY_CARE_PROVIDER_SITE_OTHER): Payer: BLUE CROSS/BLUE SHIELD | Admitting: Podiatry

## 2015-02-15 DIAGNOSIS — M79606 Pain in leg, unspecified: Secondary | ICD-10-CM | POA: Diagnosis not present

## 2015-02-15 DIAGNOSIS — B351 Tinea unguium: Secondary | ICD-10-CM

## 2015-02-15 NOTE — Progress Notes (Signed)
   Subjective:    Patient ID: Amy Russo, female    DOB: 10/12/1950, 64 y.o.   MRN: [id number]  HPI   64 year old female presents the office they for diabetic risk assessment and painful elongated nails which she is unable to trim herself. She denies any redness or drainage to the nails. She states that she gets some intermittent discomfort to the top of her feet how she denies any pain at this time. She also states that she's had some swelling which has been ongoing for several months. She states that she previously had [place] studies performed as were normal. Denies any redness or any increase in warmth with her feet. Denies any history of injury, recently. No other complaints at this time in no acute changes since last appointment.  Review of Systems  Constitutional: Positive for fatigue.  Musculoskeletal: Positive for arthralgias.  All other systems reviewed and are negative.      Objective:   Physical Exam AAO 3, NAD DP/PT pulses palpable bilaterally, CRT less than 3 seconds Protective sensation intact with Simms Weinstein monofilament, vibratory sensation intact, Achilles tendon reflex intact. There is a high arch cavus foot type present bilaterally plantar flexed metatarsals. There is some subjective tenderness along the dorsal aspect of the midfoot other is no tenderness to palpation at  this time. There is no overlying edema, erythema, increased warmth. Nails are hypertrophic, dystrophic, discolored, elongated, brittle 10. There is tenderness to palpation overlying nails 1-5 bilaterally. No surrounding erythema or drainage or nail sites. No open lesions or pre-ulcer lesions identified this time. No pain with calf compression, swelling, warmth, erythema.     Assessment & Plan:  64 year old female presents for diabetic risk assessment, symptomatic onychomycosis; cavus foot type -Treatment options discussed including all alternatives, risks, and complications -Nail sharply  debrided 10 without consultation/bleeding -Discussed importance of daily foot inspection. Discussed call the office immediately should any changes occur.  -Follow-up in 3 months or sooner if any problems are to arise. In the meantime I encouraged to call the office with any questions, concerns, change in symptoms. Follow-up with PCP for other issues mentioned in the ROS.  Celesta Gentile, DPM

## 2015-02-27 ENCOUNTER — Ambulatory Visit: Payer: Self-pay | Admitting: Podiatry

## 2015-03-16 ENCOUNTER — Telehealth: Payer: Self-pay | Admitting: *Deleted

## 2015-03-16 NOTE — Telephone Encounter (Addendum)
Pt request rx for compression hose to be sent to [facility], Attn: [employer].  Dr. Jacqualyn Posey ordered 20-2mmHg compression hose B/L to the knee, 1 pair prn 1 year.  Faxed to [facility] [phone].

## 2015-03-16 NOTE — Telephone Encounter (Signed)
yes

## 2015-03-22 ENCOUNTER — Telehealth: Payer: Self-pay | Admitting: *Deleted

## 2015-03-22 NOTE — Telephone Encounter (Signed)
Pt states he has questions concerning the swelling in her left ankle, asked if Dr. Jacqualyn Posey mentioned MRI in his chart notes 02/15/2015, but she said the vein test she had before said she had a vein that could not push the blood back up.  I told her Dr. Leigh Aurora dictation did not mention an MRI.  She stated she had not purchased the compression hose, because her swelling had gone down, but was still present.  I explained to the pt the compression hose helped massage the veins against the muscle to transport waste fluid out through kidney, lung, if this was not accomplished then swelling and inflammation would occur and at worse venous ulcer or cellulitis.  Pt states she was really concerned because she has a 16 hour flight in September.  Pt also states she has a hard place on her medial left ankle and wonder what it was; I told her I wasn't certain what it was but felt she need to have an appt to have checked and to discuss her flight health before the actual trip.  Pt agreed and was transferred to schedulers.

## 2015-03-24 ENCOUNTER — Telehealth: Payer: Self-pay | Admitting: *Deleted

## 2015-03-24 ENCOUNTER — Ambulatory Visit (INDEPENDENT_AMBULATORY_CARE_PROVIDER_SITE_OTHER): Payer: BLUE CROSS/BLUE SHIELD | Admitting: Podiatry

## 2015-03-24 ENCOUNTER — Encounter: Payer: Self-pay | Admitting: Podiatry

## 2015-03-24 VITALS — BP 127/59 | HR 81 | Resp 18

## 2015-03-24 DIAGNOSIS — R600 Localized edema: Secondary | ICD-10-CM | POA: Diagnosis not present

## 2015-03-24 DIAGNOSIS — R609 Edema, unspecified: Secondary | ICD-10-CM

## 2015-03-24 NOTE — Telephone Encounter (Signed)
Dr. Jacqualyn Posey ordered Venous Reflux studies.

## 2015-03-28 NOTE — Progress Notes (Signed)
Patient ID: Amy Russo, female   DOB: 08-Aug-1951, 64 y.o.   MRN: [id number]  Subjective: 64 year old female presents the office they with can surgeons for continue swelling to both of her ankles and legs. She states that she has an appointment on Monday for compression socks. She did bring some in today as well. She states that she has a hard spot to her legs which she points to the posterior portion distally. She states this been ongoing for several months and this was present prior to last venous study performed. She says ascending and starting to happen on the right side as well. She denies any redness or any increase in warmth. She also be going on a 16 hour flight to [place] in a couple months but she is concerned about. No other complaints at this time in no acute changes.  Objective: AAO 3, NAD DP/PT pulse palpable, CRT less than 3 seconds Protective sensation appears intact with Derrel Nip monofilament There does appear to be some mild edema which appears to be chronic bilateral lower extremities. There is no pinning edema present. There does appear to be a hardened skin area on the posterior aspect the left leg to the side of the Achilles tendon. There is no pain with calf compression bilaterally and there is no erythema or increase in warmth. There is no palpable cord identified. No open lesions or pre-ulcerative lesions. There is no pain with calf compression, swelling, warmth, erythema.  Assessment: 64 year old female with continued edema.  Plan: -She was concerned that all of the study was performed on the left side and she was wondering if something can been missed on the study to check the veins. We are going to repeat the study to have bilateral lower extremities evaluated. -Continue compression socks for now. -Unlikely that she has a DVT of the symptoms were present prior to the last ultrasound. Continue to monitor though there is any increase signs to go directly to  the emergency room. -Discussed with her that going on a long flight greatly increases her chance of blood clots. Recommended compression socks, take an aspirin starting one week prior to her flight and frequent exercises and moving around to help prevent this. -Follow-up after vascular studies or sooner if any problems are to arise. Call if questions or concerns in the meantime. Also recommended to follow up with PCP for this.   Celesta Gentile, DPM

## 2015-03-30 ENCOUNTER — Telehealth: Payer: Self-pay | Admitting: *Deleted

## 2015-03-30 DIAGNOSIS — R609 Edema, unspecified: Secondary | ICD-10-CM

## 2015-03-30 NOTE — Telephone Encounter (Signed)
"  I'm Dr. Leigh Aurora patient.  I have a question about a test I'm going to have."

## 2015-03-31 NOTE — Telephone Encounter (Addendum)
I'm returning your call.  "Yes, I was supposed to have been set up for a test.  I been waiting patiently.  Does it take this long to hear from someone to schedule?  I was there last Friday."  It's for a venous doppler correct?  "Yes, it is to check my legs."    I see the order in the system.  You can call them if you like to get it scheduled.  "I don't have a pen to write the number down right now."    I'll call and see if I can get it scheduled and call you back.  I called [facility] and scheduled her an appointment on 04/06/2015 at 3:30pm.  She stated that they are moving and apologized for not having it scheduled.  I called patient.  They scheduled your appointment for 04/06/2015.  "I'm not going to be in town then."  I'll give you the number to call and reschedule.  This is the earliest appointment they had.  She said they're moving.  "Okay, what's their number?"  Their number is  [phone].  "Who should I speak to?"  Just tell her you're calling to reschedule your doppler appointment and she'll direct you.

## 2015-04-06 ENCOUNTER — Encounter (HOSPITAL_COMMUNITY): Payer: BLUE CROSS/BLUE SHIELD

## 2015-04-13 ENCOUNTER — Ambulatory Visit (HOSPITAL_COMMUNITY)
Admission: RE | Admit: 2015-04-13 | Discharge: 2015-04-13 | Disposition: A | Discharge status: Home / Self Care | Payer: BLUE CROSS/BLUE SHIELD | Source: Ambulatory Visit | Attending: Cardiology | Admitting: Cardiology

## 2015-04-13 DIAGNOSIS — R609 Edema, unspecified: Secondary | ICD-10-CM | POA: Insufficient documentation

## 2015-04-18 DIAGNOSIS — M25529 Pain in unspecified elbow: Secondary | ICD-10-CM | POA: Insufficient documentation

## 2015-04-19 ENCOUNTER — Telehealth: Payer: Self-pay | Admitting: *Deleted

## 2015-04-19 NOTE — Telephone Encounter (Signed)
It is negative for DVT or venous insufficiency. Can continue compression stocking. Follow-up with PCP as well.

## 2015-04-19 NOTE — Telephone Encounter (Addendum)
Pt request results of her leg studies.  I informed pt of the results reviewed by Dr. Jacqualyn Posey, and she asked if he said anything about a problem in the ankle that would cause the swelling.  I told pt I would ask Dr. Jacqualyn Posey and call again.  Pt states she was calling for the information concerning her left ankle vein.  Returned pt's call with Dr. Leigh Aurora recommendation to take the 81mg  Aspirin daily, and wear the compression hose.  Pt asked if she should take 2 Aspirin 81mg  since she is already on 1 tablet daily.  Dr. Jacqualyn Posey states only 1 Aspirin 81mg  daily, I informed pt.

## 2015-05-09 ENCOUNTER — Emergency Department (HOSPITAL_BASED_OUTPATIENT_CLINIC_OR_DEPARTMENT_OTHER)
Admission: EM | Admit: 2015-05-09 | Discharge: 2015-05-09 | Disposition: A | Discharge status: Home / Self Care | Payer: BLUE CROSS/BLUE SHIELD | Attending: Emergency Medicine | Admitting: Emergency Medicine

## 2015-05-09 ENCOUNTER — Encounter (HOSPITAL_BASED_OUTPATIENT_CLINIC_OR_DEPARTMENT_OTHER): Payer: Self-pay | Admitting: *Deleted

## 2015-05-09 DIAGNOSIS — Z79899 Other long term (current) drug therapy: Secondary | ICD-10-CM | POA: Diagnosis not present

## 2015-05-09 DIAGNOSIS — Z87891 Personal history of nicotine dependence: Secondary | ICD-10-CM | POA: Insufficient documentation

## 2015-05-09 DIAGNOSIS — H9319 Tinnitus, unspecified ear: Secondary | ICD-10-CM | POA: Diagnosis not present

## 2015-05-09 DIAGNOSIS — Z7982 Long term (current) use of aspirin: Secondary | ICD-10-CM | POA: Diagnosis not present

## 2015-05-09 DIAGNOSIS — Z8719 Personal history of other diseases of the digestive system: Secondary | ICD-10-CM | POA: Diagnosis not present

## 2015-05-09 DIAGNOSIS — R110 Nausea: Secondary | ICD-10-CM | POA: Insufficient documentation

## 2015-05-09 DIAGNOSIS — E119 Type 2 diabetes mellitus without complications: Secondary | ICD-10-CM | POA: Insufficient documentation

## 2015-05-09 DIAGNOSIS — I10 Essential (primary) hypertension: Secondary | ICD-10-CM | POA: Diagnosis not present

## 2015-05-09 DIAGNOSIS — Z794 Long term (current) use of insulin: Secondary | ICD-10-CM | POA: Diagnosis not present

## 2015-05-09 DIAGNOSIS — E039 Hypothyroidism, unspecified: Secondary | ICD-10-CM | POA: Diagnosis not present

## 2015-05-09 DIAGNOSIS — R42 Dizziness and giddiness: Secondary | ICD-10-CM | POA: Diagnosis not present

## 2015-05-09 DIAGNOSIS — E669 Obesity, unspecified: Secondary | ICD-10-CM | POA: Diagnosis not present

## 2015-05-09 DIAGNOSIS — R002 Palpitations: Secondary | ICD-10-CM | POA: Diagnosis not present

## 2015-05-09 LAB — COMPREHENSIVE METABOLIC PANEL
ALBUMIN: 3.9 g/dL (ref 3.5–5.0)
ALT: 23 U/L (ref 14–54)
AST: 25 U/L (ref 15–41)
Alkaline Phosphatase: 75 U/L (ref 38–126)
Anion gap: 7 (ref 5–15)
BILIRUBIN TOTAL: 0.5 mg/dL (ref 0.3–1.2)
BUN: 16 mg/dL (ref 6–20)
CO2: 27 mmol/L (ref 22–32)
CREATININE: 0.7 mg/dL (ref 0.44–1.00)
Calcium: 9.5 mg/dL (ref 8.9–10.3)
Chloride: 107 mmol/L (ref 101–111)
GFR calc Af Amer: >60 mL/min (ref >60)
GFR calc non Af Amer: >60 mL/min (ref >60)
Glucose, Bld: 137 mg/dL — ABNORMAL HIGH (ref 65–99)
POTASSIUM: 3.9 mmol/L (ref 3.5–5.1)
Sodium: 141 mmol/L (ref 135–145)
TOTAL PROTEIN: 6.7 g/dL (ref 6.5–8.1)

## 2015-05-09 LAB — CBG MONITORING, ED: Glucose-Capillary: 148 mg/dL — ABNORMAL HIGH (ref 65–99)

## 2015-05-09 LAB — CBC WITH DIFFERENTIAL/PLATELET
BASOS ABS: 0 10*3/uL (ref 0.0–0.1)
Basophils Relative: 1 % (ref 0–1)
EOS ABS: 0.3 10*3/uL (ref 0.0–0.7)
Eosinophils Relative: 3 % (ref 0–5)
HCT: 38.3 % (ref 36.0–46.0)
Hemoglobin: 12.8 g/dL (ref 12.0–15.0)
LYMPHS PCT: 25 % (ref 12–46)
Lymphs Abs: 2.1 10*3/uL (ref 0.7–4.0)
MCH: 29.5 pg (ref 26.0–34.0)
MCHC: 33.4 g/dL (ref 30.0–36.0)
MCV: 88.2 fL (ref 78.0–100.0)
MONO ABS: 0.5 10*3/uL (ref 0.1–1.0)
Monocytes Relative: 7 % (ref 3–12)
Neutro Abs: 5.4 10*3/uL (ref 1.7–7.7)
Neutrophils Relative %: 64 % (ref 43–77)
Platelets: 271 10*3/uL (ref 150–400)
RBC: 4.34 MIL/uL (ref 3.87–5.11)
RDW: 14.1 % (ref 11.5–15.5)
WBC: 8.3 10*3/uL (ref 4.0–10.5)

## 2015-05-09 LAB — TROPONIN I: Troponin I: <0.03 ng/mL (ref <0.031)

## 2015-05-09 MED ORDER — SODIUM CHLORIDE 0.9 % IV BOLUS (SEPSIS)
1000.0000 mL | Freq: Once | INTRAVENOUS | Status: AC
  Administered 2015-05-09: 1000 mL via INTRAVENOUS

## 2015-05-09 NOTE — ED Notes (Signed)
Denies any chest pain, states can feel heart fluttering at times

## 2015-05-09 NOTE — ED Notes (Signed)
12Lead EKG done, to EDP for evaluation

## 2015-05-09 NOTE — ED Provider Notes (Signed)
CSN: [id number]     Arrival date & time 05/09/15  1133 History   First MD Initiated Contact with Patient 05/09/15 1155     Chief Complaint  Patient presents with  . Dizziness     (Consider location/radiation/quality/duration/timing/severity/associated sxs/prior Treatment) Patient is a 64 y.o. female presenting with dizziness.  Dizziness Quality:  Lightheadedness Severity:  Severe Onset quality:  Sudden Duration:  4 hours Timing:  Intermittent Progression:  Waxing and waning Chronicity:  New Context: bending over, head movement and standing up   Relieved by:  Lying down Worsened by:  Standing up and turning head Associated symptoms: nausea, palpitations (two brief episodes) and tinnitus (worse over last few days)   Associated symptoms: no blood in stool, no chest pain, no diarrhea, no headaches, no hearing loss, no shortness of breath, no syncope, no vomiting and no weakness   Risk factors: no heart disease, no hx of stroke, no hx of vertigo, no multiple medications and no new medications     Past Medical History  Diagnosis Date  . Aortic valve disease   . Esophageal stricture   . Diabetes mellitus   . HTN (hypertension)   . Obesity   . Hypothyroidism   . Barrett's esophagus    Past Surgical History  Procedure Laterality Date  . Avr  12/2006    #23 Edwards Magna pericardial valve  . Trigger finger release    . Tubal ligation    . Tonsillectomy    . Axillary artery aneurysm repair      left  . Carpal tunnel release Left    History reviewed. No pertinent family history. Social History  Substance Use Topics  . Smoking status: Former Smoker    Quit date: 09/26/1985  . Smokeless tobacco: None  . Alcohol Use: 0.0 oz/week     Comment: ocassionally   OB History    No data available     Review of Systems  Constitutional: Negative for fever.  HENT: Positive for tinnitus (worse over last few days). Negative for hearing loss and sore throat.   Eyes: Negative for  visual disturbance.  Respiratory: Negative for cough and shortness of breath.   Cardiovascular: Positive for palpitations (two brief episodes). Negative for chest pain, leg swelling and syncope.  Gastrointestinal: Positive for nausea. Negative for vomiting, abdominal pain, diarrhea and blood in stool.  Genitourinary: Negative for difficulty urinating.  Musculoskeletal: Negative for back pain and neck pain.  Skin: Negative for rash.  Neurological: Positive for dizziness and light-headedness. Negative for seizures, syncope, facial asymmetry, speech difficulty, weakness, numbness and headaches.      Allergies  Epinephrine  Home Medications   Prior to Admission medications   Medication Sig Start Date End Date Taking? Authorizing Provider  aspirin 81 MG tablet Take 81 mg by mouth daily.     Historical Provider, MD  atorvastatin (LIPITOR) 20 MG tablet Take 1 tablet by mouth daily. 10/20/14   Historical Provider, MD  Calcium Carbonate-Vitamin D (CALCIUM + D PO) Take 600-800 mg by mouth daily.    Historical Provider, MD  Cyanocobalamin (VITAMIN B-12 PO) Take 1,000 mg by mouth daily.     Historical Provider, MD  Insulin Aspart (NOVOLOG [place]) Inject into the skin. 2-6 units as needed    Historical Provider, MD  Insulin Glargine (LANTUS [place]) Inject 35 Units into the skin at bedtime.     Historical Provider, MD  levothyroxine (SYNTHROID, LEVOTHROID) 88 MCG tablet Take 75 mcg by mouth daily.  Historical Provider, MD  lisinopril (PRINIVIL,ZESTRIL) 20 MG tablet Take 1 tablet (20 mg total) by mouth daily. 01/09/11 11/25/13  Peter M Martinique, MD  Multiple Vitamin (MULTI-VITAMIN PO) Take by mouth daily.    Historical Provider, MD  Omega-3 Fatty Acids (FISH OIL PO) Take 1,200 mg by mouth daily.     Historical Provider, MD  omeprazole (PRILOSEC) 40 MG capsule Take 20 mg by mouth 2 (two) times daily.  09/25/12   Historical Provider, MD  ONE TOUCH ULTRA TEST test strip  09/11/12   Historical Provider, MD   Pyridoxine HCl (VITAMIN B-6 PO) Take 100 mg by mouth 2 (two) times daily.     Historical Provider, MD  temazepam (RESTORIL) 15 MG capsule Take 1 capsule by mouth daily as needed. 11/14/14   Historical Provider, MD  traMADol (ULTRAM) 50 MG tablet as needed. 09/23/13   Historical Provider, MD  TURMERIC PO Take by mouth daily.    Historical Provider, MD   BP 153/56 mmHg  Pulse 78  Resp 16  SpO2 98% Physical Exam  Constitutional: She is oriented to person, place, and time. She appears well-developed and well-nourished. No distress.  HENT:  Head: Normocephalic and atraumatic.  Mouth/Throat: Oropharynx is clear and moist. No oropharyngeal exudate.  Eyes: Conjunctivae and EOM are normal.  Neck: Normal range of motion.  Cardiovascular: Normal rate, regular rhythm, normal heart sounds and intact distal pulses.  Exam reveals no gallop and no friction rub.   No murmur heard. Pulmonary/Chest: Effort normal and breath sounds normal. No respiratory distress. She has no wheezes. She has no rales.  Abdominal: Soft. She exhibits no distension. There is no tenderness. There is no guarding.  Musculoskeletal: She exhibits no edema or tenderness.  Neurological: She is alert and oriented to person, place, and time. She has normal strength. No cranial nerve deficit or sensory deficit. Coordination and gait normal. GCS eye subscore is 4. GCS verbal subscore is 5. GCS motor subscore is 6.  Skin: Skin is warm and dry. No rash noted. She is not diaphoretic. No erythema.  Nursing note and vitals reviewed.   ED Course  Procedures (including critical care time) Labs Review Labs Reviewed  COMPREHENSIVE METABOLIC PANEL - Abnormal; Notable for the following:    Glucose, Bld 137 (*)    All other components within normal limits  CBG MONITORING, ED - Abnormal; Notable for the following:    Glucose-Capillary 148 (*)    All other components within normal limits  CBC WITH DIFFERENTIAL/PLATELET  TROPONIN I     Imaging Review No results found. I have personally reviewed and evaluated these images and lab results as part of my medical decision-making.   EKG Interpretation   Date/Time:  Tuesday May 09 2015 12:03:56 EDT Ventricular Rate:  73 PR Interval:  152 QRS Duration: 80 QT Interval:  378 QTC Calculation: 416 R Axis:   39 Text Interpretation:  Unusual P axis, possible ectopic atrial rhythm Low  voltage QRS Septal infarct , age undetermined T wave abnormality, consider  lateral ischemia Abnormal ECG No significant change since last tracing  However, pw downward in aVR and will repeat EKG to check lead placement  Confirmed by [facility] MD, [place] ([id number]) on 05/09/2015 1:19:31 PM      MDM   Final diagnoses:  Palpitations  Lightheadedness   64yo female with history of DM, htn, aortic valve disease with AVR, chronic tinnitus (sees ENT), presents with concern of lightheadedness worsened by standing and head movements and nausea.  DDx for syncope includes cardiac arrhythmia, MI, PE, electrolyte abnormality, hypovolemia including dehydration and anemia/GI bleed, infection.   EKG evaluated by me and shows sinus rhythm with no sign of prolonged QTc, no brugada, no sign of HOCM, no ST abnormalities. (Initial EKG with concern of unsual P axis, however on repeat was within normal limits and pw abnormality likely secondary to lead placement.) No CP or SOB and troponin within normal limits and have low suspicion for MI/PE.  No sign of electrolyte abnormality, bleeding by CMP/CBC and hx.  Symptoms improved after IVF.  No sign of arrhythmia during ED stay, however recommend close outpatient follow up with her Cardiologist regarding possible arrhythmia (esp given palpitations) or valvular issues, however given currently asymptomatic and stable recommend outpt follow up.  Pt describes lightheadedness, but symptoms may also represent atypical vertigo/inner ear problem given worsening tinnitus. No other  neurologic symptoms and doubt TIA/stroke.  Patient discharged in stable condition with understanding of reasons to return and will follow up with PCP and cardiology.      Gareth Morgan, MD 05/09/15 2252

## 2015-05-09 NOTE — ED Notes (Signed)
Arrived via EMS with dizziness and nausea. Began this am, driving during onset of episode.

## 2015-05-09 NOTE — ED Notes (Signed)
Pt amb to room 3 with ems, pt reports "35 years" of tinnitus, worse over the last few days, now she has dizzyness when she turns her head.

## 2015-05-09 NOTE — ED Notes (Signed)
FAST assessment: Negative 

## 2015-05-09 NOTE — ED Notes (Signed)
EDP in at bedside to speak with pt re: results and examination

## 2015-05-09 NOTE — ED Notes (Signed)
Pt ambulated to br, min assistance required, gait very steady

## 2015-05-10 ENCOUNTER — Ambulatory Visit (INDEPENDENT_AMBULATORY_CARE_PROVIDER_SITE_OTHER): Payer: BLUE CROSS/BLUE SHIELD | Admitting: Physician Assistant

## 2015-05-10 ENCOUNTER — Encounter: Payer: Self-pay | Admitting: Physician Assistant

## 2015-05-10 VITALS — BP 178/85 | HR 72 | Ht 60.0 in | Wt 193.8 lb

## 2015-05-10 DIAGNOSIS — Z954 Presence of other heart-valve replacement: Secondary | ICD-10-CM | POA: Diagnosis not present

## 2015-05-10 DIAGNOSIS — R42 Dizziness and giddiness: Secondary | ICD-10-CM

## 2015-05-10 DIAGNOSIS — Z952 Presence of prosthetic heart valve: Secondary | ICD-10-CM

## 2015-05-10 NOTE — Patient Instructions (Signed)
Your physician has requested that you have an echocardiogram. Echocardiography is a painless test that uses sound waves to create images of your heart. It provides your doctor with information about the size and shape of your heart and how well your heart's chambers and valves are working. This procedure takes approximately one hour. There are no restrictions for this procedure.  Rosaria Ferries, PA-C, recommends that you schedule a follow-up appointment in 3 months with Dr Martinique.

## 2015-05-10 NOTE — Progress Notes (Signed)
Cardiology Office Note   Date:  05/10/2015   ID:  YURANI FETTES, DOB 31-Jul-1951, MRN [id number]  PCP:  Geoffery Lyons, MD  Cardiologist:   Dr. Martinique  Last office visit 01/10/2015 Rosaria Ferries, PA-C   Chief Complaint  Patient presents with  . ROV    patient was seen in the ED yesterday. was told to follow up with her Cardiologist.    History of Present Illness: Amy Russo is a 64 y.o. 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 Her last echocardiogram was in 2015.  Amy Russo presents for  Evaluation of dizziness, lightheaded feeling.    Amy Russo was in her usual state of health until yesterday when she had onset of a lightheaded feeling. She would be lightheaded sitting still, and the feeling was worse when she stood up. The room was not spinning. She felt unsafe walking without holding onto something. She did not feel like she was about to lose consciousness. She was not having chest pain. She was having  A few short palpitations but no prolonged palpitations. Mainly, her heart skips once in a while.   She went to the emergency room , where her kidney function, electrolytes and blood counts were okay.  Her ECG was okay and there was no significant arrhythmias on telemetry. Orthostatic vital signs were not performed, but her blood pressure was elevated between 902 and 409 systolic. She was discharged from the emergency room and is here today in follow-up.   her symptoms have resolved, and she is not having any problems with being lightheaded today. The only thing that she had done recently that was different was, the night before her symptoms, she had used a great deal of Serenity essential oil because she had not been sleeping well.   Past Medical History  Diagnosis Date  . Aortic valve disease   . Esophageal stricture   . Diabetes mellitus   . HTN (hypertension)   . Obesity   .  Hypothyroidism   . Madex Seals's esophagus     Past Surgical History  Procedure Laterality Date  . Avr  12/2006    #23 Edwards Magna pericardial valve  . Trigger finger release    . Tubal ligation    . Tonsillectomy    . Axillary artery aneurysm repair      left  . Carpal tunnel release Left     Current Outpatient Prescriptions  Medication Sig Dispense Refill  . aspirin 81 MG tablet Take 81 mg by mouth daily.     Marland Kitchen atorvastatin (LIPITOR) 20 MG tablet Take 1 tablet by mouth daily.  0  . Calcium Carbonate-Vitamin D (CALCIUM + D PO) Take 600-800 mg by mouth daily.    . Cyanocobalamin (VITAMIN B-12 PO) Take 1,000 mg by mouth daily.     . Insulin Aspart (NOVOLOG [place]) Inject into the skin. 2-6 units as needed    . Insulin Glargine (LANTUS [place]) Inject 35 Units into the skin at bedtime.     Marland Kitchen levothyroxine (SYNTHROID, LEVOTHROID) 88 MCG tablet Take 75 mcg by mouth daily.     . Multiple Vitamin (MULTI-VITAMIN PO) Take by mouth daily.    . Omega-3 Fatty Acids (FISH OIL PO) Take 1,200 mg by mouth daily.     Marland Kitchen omeprazole (PRILOSEC) 40 MG capsule Take 20 mg by mouth 2 (two) times daily.     . ONE TOUCH  ULTRA TEST test strip     . Pyridoxine HCl (VITAMIN B-6 PO) Take 100 mg by mouth 2 (two) times daily.     . temazepam (RESTORIL) 15 MG capsule Take 1 capsule by mouth daily as needed.  2  . traMADol (ULTRAM) 50 MG tablet as needed.    . TURMERIC PO Take by mouth daily.    Marland Kitchen lisinopril (PRINIVIL,ZESTRIL) 20 MG tablet Take 1 tablet (20 mg total) by mouth daily. 30 tablet 5   No current facility-administered medications for this visit.    Allergies:   Epinephrine    Social History:  The patient  reports that she quit smoking about 29 years ago. She does not have any smokeless tobacco history on file. She reports that she drinks alcohol. She reports that she does not use illicit drugs.   Family History:  The patient's  Are deceased.    ROS:  Please see the history of present illness. All other  systems are reviewed and negative.    PHYSICAL EXAM: VS:  BP 178/85 mmHg  Pulse 72  Ht 5' (1.524 m)  Wt 193 lb 12.8 oz (87.907 kg)  BMI 37.85 kg/m2 , BMI Body mass index is 37.85 kg/(m^2). GEN: Well nourished, well developed, in no acute distress HEENT: normal Neck: no JVD, carotid bruits, or masses Cardiac: RRR;  Short systolic murmur, no rubs, or gallops,no edema  Respiratory:  clear to auscultation bilaterally, normal work of breathing GI: soft, nontender, nondistended, + BS MS: no deformity or atrophy Skin: warm and dry, no rash Neuro:  Strength and sensation are intact Psych: euthymic mood, full affect   EKG:  EKG is not ordered today.  Recent Labs: 05/09/2015: ALT 23; BUN 16; Creatinine, Ser 0.70; Hemoglobin 12.8; Platelets 271; Potassium 3.9; Sodium 141    Lipid Panel No results found for: CHOL, TRIG, HDL, CHOLHDL, VLDL, LDLCALC, LDLDIRECT   Wt Readings from Last 3 Encounters:  05/10/15 193 lb 12.8 oz (87.907 kg)  01/10/15 190 lb 9.6 oz (86.456 kg)  11/25/13 175 lb 9.6 oz (79.652 kg)     Other studies Reviewed: Additional studies/ records that were reviewed today include: last echocardiogram, ER records and recent office visits.  ASSESSMENT AND PLAN:  1.   Lightheadedness: her symptoms have resolved. Her blood pressure somewhat elevated, but she has been under some stress. Home BP generally runs between 106 and 269 systolic.  Her blood pressure has been running higher recently but is encouraged to continue checking it, and follow up with her primary M.D.   2. History of bioprosthetic aortic valve replacement : her last echocardiogram was in 2013. On physical exam there are no issues , but we will repeat this.   Of note, next Wednesday she is going to [place] for 2 weeks.  We will try to get the echocardiogram prior to then.  Current medicines are reviewed at length with the patient today.  The patient does not have concerns regarding medicines.  The  following changes have been made:  no change  Labs/ tests ordered today include:   Orders Placed This Encounter  Procedures  . ECHOCARDIOGRAM COMPLETE     Disposition:   FU with  Dr. Martinique in 3 months.  Augusto Garbe  05/10/2015 12:46 PM    [facility] [place], [place], [place]  [address] Phone: [phone]; Fax: [phone]

## 2015-05-15 ENCOUNTER — Telehealth: Payer: Self-pay | Admitting: Cardiology

## 2015-05-15 ENCOUNTER — Ambulatory Visit (INDEPENDENT_AMBULATORY_CARE_PROVIDER_SITE_OTHER): Payer: BLUE CROSS/BLUE SHIELD | Admitting: Podiatry

## 2015-05-15 ENCOUNTER — Encounter: Payer: Self-pay | Admitting: Podiatry

## 2015-05-15 ENCOUNTER — Other Ambulatory Visit: Payer: Self-pay

## 2015-05-15 ENCOUNTER — Ambulatory Visit (HOSPITAL_COMMUNITY): Payer: BLUE CROSS/BLUE SHIELD | Attending: Physician Assistant

## 2015-05-15 VITALS — BP 170/88 | HR 90 | Resp 18

## 2015-05-15 DIAGNOSIS — E119 Type 2 diabetes mellitus without complications: Secondary | ICD-10-CM | POA: Diagnosis not present

## 2015-05-15 DIAGNOSIS — I351 Nonrheumatic aortic (valve) insufficiency: Secondary | ICD-10-CM | POA: Diagnosis not present

## 2015-05-15 DIAGNOSIS — M21969 Unspecified acquired deformity of unspecified lower leg: Secondary | ICD-10-CM | POA: Diagnosis not present

## 2015-05-15 DIAGNOSIS — M722 Plantar fascial fibromatosis: Secondary | ICD-10-CM | POA: Diagnosis not present

## 2015-05-15 DIAGNOSIS — Z954 Presence of other heart-valve replacement: Secondary | ICD-10-CM | POA: Diagnosis not present

## 2015-05-15 DIAGNOSIS — I059 Rheumatic mitral valve disease, unspecified: Secondary | ICD-10-CM | POA: Insufficient documentation

## 2015-05-15 DIAGNOSIS — Q667 Congenital pes cavus: Secondary | ICD-10-CM

## 2015-05-15 DIAGNOSIS — I071 Rheumatic tricuspid insufficiency: Secondary | ICD-10-CM | POA: Insufficient documentation

## 2015-05-15 DIAGNOSIS — M216X9 Other acquired deformities of unspecified foot: Secondary | ICD-10-CM | POA: Diagnosis not present

## 2015-05-15 DIAGNOSIS — I359 Nonrheumatic aortic valve disorder, unspecified: Secondary | ICD-10-CM | POA: Diagnosis present

## 2015-05-15 DIAGNOSIS — R600 Localized edema: Secondary | ICD-10-CM | POA: Diagnosis not present

## 2015-05-15 DIAGNOSIS — Z952 Presence of prosthetic heart valve: Secondary | ICD-10-CM

## 2015-05-15 NOTE — Telephone Encounter (Signed)
Returned call to patient.Echo results not available.I will call you with results 05/16/15.

## 2015-05-15 NOTE — Telephone Encounter (Signed)
Pt called in stating that she had an Echo done this morning and she is wanting to results as soon as possible considering that she will be leaving for [place] early Wednesday morning. Please f/u with her   Thanks

## 2015-05-16 NOTE — Telephone Encounter (Signed)
New message    Patient calling      Waiting on echo results.

## 2015-05-16 NOTE — Progress Notes (Signed)
Patient ID: Amy Russo, female   DOB: 12-29-1950, 64 y.o.   MRN: [id number]  Subjective: 64 year old female presents the office today for follow up evaluation of bilateral lower extremity edema. She states that since last appointment the swelling has completely resolved and she's had no problems. She does that she gets calluses the ball of her feet which she sands down herself, she does not use a blade to cut her calluses. She has one orthotics previously and she believes that she needs a new pair. Denies any recent injury or trauma. No swelling or redness. No other complaint at this time.   Objective: AAO 3, NAD DP/PT pulses palpable, CRT less than 3 seconds Protective sensation intact with Derrel Nip monofilament There is an increase in calcaneal inclination angle. This plantar flexed first metatarsal with prominence the metatarsal heads plantarly. There is atrophy of the fat pad. There is mild diffuse hyperkeratotic lesions bilateral submetatarsal one of 5. There is no area pinpoint bony tenderness or pain the vibratory sensation. No overlying edema, erythema, increase in warmth. No other open lesions or pre-ulcerative lesions. No pain with calf compression, swelling, warmth, erythema.  Assessment: 64 year old female with resolved edema, cavus foot with prominent metatarsal heads.  Plan: -Treatment options discussed including all alternatives, risks, and complications -Continue monitor for recurrence swelling. She is going to check to [place] on Wednesday. Recommended continue aspirin, compression stockings, and regular stretching/walking on the airplane. Monitor for signs or symptoms of DVT/PE. -Given her foot type I do believe that she would benefit from custom orthotics. At this appointment she was scanned and they were sent to [facility] labs. -Follow-up after orthotics or sooner if any problems arise. In the meantime, encouraged to call the office with any questions, concerns, change in  symptoms.   Celesta Gentile, DPM

## 2015-05-16 NOTE — Telephone Encounter (Signed)
Returned call to patient echo results given. 

## 2015-06-13 ENCOUNTER — Ambulatory Visit: Payer: BLUE CROSS/BLUE SHIELD | Admitting: *Deleted

## 2015-06-13 DIAGNOSIS — M216X9 Other acquired deformities of unspecified foot: Secondary | ICD-10-CM

## 2015-06-13 NOTE — Progress Notes (Signed)
Patient ID: Amy Russo, female   DOB: [birth year], 64 y.o.   MRN: [id number] Patient presents for orthotic pick up.  Verbal and written break in and wear instructions given.  Patient will follow up in 4 weeks if symptoms worsen or fail to improve.

## 2015-06-13 NOTE — Patient Instructions (Signed)

## 2015-09-06 ENCOUNTER — Ambulatory Visit: Payer: BLUE CROSS/BLUE SHIELD | Admitting: Cardiology

## 2015-10-17 ENCOUNTER — Other Ambulatory Visit: Payer: Self-pay | Admitting: Neurosurgery

## 2015-10-23 ENCOUNTER — Telehealth: Payer: Self-pay | Admitting: Cardiology

## 2015-10-23 NOTE — Telephone Encounter (Signed)
Patient called in and wants to know if OK to go thru back surgery with Dr. Ashok Pall?  Per EPIC: POSTERIOR LUMBAR FUSION 1 LEVEL N/A General   L45 decompression with posterior lumbar interbody fusion with interbody prosthesis posterior lateral arthrodesis and posterior nonsegmental instrumentation          Would need to hold meds? How long?  Patient states she is unsure Dr. Christella Noa is even aware of her cardiac history - advised she notify surgeon before the procedure.   Message routed to Dr. Martinique

## 2015-10-23 NOTE — Telephone Encounter (Signed)
Patient called and notified of MD advice.  Telephone note with clearance routed to Dr. Christella Noa via [facility] fax

## 2015-10-23 NOTE — Telephone Encounter (Signed)
She is cleared from a cardiac standpoint for back surgery. She may hold ASA for one week prior.  Dortha Neighbors Martinique MD, [facility]

## 2015-10-23 NOTE — Telephone Encounter (Signed)
New Message  PT wanted to discuss possible back surgery w/ RN- sched for 3/17. Please call back and discuss.

## 2015-11-01 DIAGNOSIS — E1065 Type 1 diabetes mellitus with hyperglycemia: Secondary | ICD-10-CM | POA: Diagnosis not present

## 2015-11-01 DIAGNOSIS — M5416 Radiculopathy, lumbar region: Secondary | ICD-10-CM | POA: Diagnosis not present

## 2015-11-01 DIAGNOSIS — Z6837 Body mass index (BMI) 37.0-37.9, adult: Secondary | ICD-10-CM | POA: Diagnosis not present

## 2015-11-02 DIAGNOSIS — M25512 Pain in left shoulder: Secondary | ICD-10-CM | POA: Diagnosis not present

## 2015-11-02 DIAGNOSIS — G8929 Other chronic pain: Secondary | ICD-10-CM | POA: Diagnosis not present

## 2015-11-06 DIAGNOSIS — G8929 Other chronic pain: Secondary | ICD-10-CM | POA: Diagnosis not present

## 2015-11-06 DIAGNOSIS — M25512 Pain in left shoulder: Secondary | ICD-10-CM | POA: Diagnosis not present

## 2015-11-07 ENCOUNTER — Encounter: Payer: Self-pay | Admitting: Cardiology

## 2015-11-07 ENCOUNTER — Ambulatory Visit (INDEPENDENT_AMBULATORY_CARE_PROVIDER_SITE_OTHER): Payer: Medicare Other | Admitting: Cardiology

## 2015-11-07 VITALS — BP 140/90 | HR 74 | Ht 61.5 in | Wt 195.1 lb

## 2015-11-07 DIAGNOSIS — I359 Nonrheumatic aortic valve disorder, unspecified: Secondary | ICD-10-CM | POA: Diagnosis not present

## 2015-11-07 DIAGNOSIS — Z952 Presence of prosthetic heart valve: Secondary | ICD-10-CM

## 2015-11-07 DIAGNOSIS — I10 Essential (primary) hypertension: Secondary | ICD-10-CM | POA: Diagnosis not present

## 2015-11-07 DIAGNOSIS — Z954 Presence of other heart-valve replacement: Secondary | ICD-10-CM

## 2015-11-07 NOTE — Patient Instructions (Signed)
Continue your current therapy  I will see you in one year  You are cleared for back surgery

## 2015-11-08 ENCOUNTER — Encounter (HOSPITAL_COMMUNITY): Payer: Self-pay

## 2015-11-08 ENCOUNTER — Encounter (HOSPITAL_COMMUNITY)
Admission: RE | Admit: 2015-11-08 | Discharge: 2015-11-08 | Disposition: A | Discharge status: Home / Self Care | Payer: Medicare Other | Source: Ambulatory Visit | Attending: Neurosurgery | Admitting: Neurosurgery

## 2015-11-08 DIAGNOSIS — E119 Type 2 diabetes mellitus without complications: Secondary | ICD-10-CM | POA: Insufficient documentation

## 2015-11-08 DIAGNOSIS — Z79899 Other long term (current) drug therapy: Secondary | ICD-10-CM | POA: Diagnosis not present

## 2015-11-08 DIAGNOSIS — I10 Essential (primary) hypertension: Secondary | ICD-10-CM | POA: Diagnosis not present

## 2015-11-08 DIAGNOSIS — M25512 Pain in left shoulder: Secondary | ICD-10-CM | POA: Diagnosis not present

## 2015-11-08 DIAGNOSIS — M65351 Trigger finger, right little finger: Secondary | ICD-10-CM | POA: Diagnosis not present

## 2015-11-08 DIAGNOSIS — Z01812 Encounter for preprocedural laboratory examination: Secondary | ICD-10-CM | POA: Insufficient documentation

## 2015-11-08 DIAGNOSIS — Z953 Presence of xenogenic heart valve: Secondary | ICD-10-CM | POA: Insufficient documentation

## 2015-11-08 DIAGNOSIS — Z794 Long term (current) use of insulin: Secondary | ICD-10-CM | POA: Insufficient documentation

## 2015-11-08 DIAGNOSIS — Z87891 Personal history of nicotine dependence: Secondary | ICD-10-CM | POA: Insufficient documentation

## 2015-11-08 DIAGNOSIS — Z01818 Encounter for other preprocedural examination: Secondary | ICD-10-CM | POA: Insufficient documentation

## 2015-11-08 DIAGNOSIS — E039 Hypothyroidism, unspecified: Secondary | ICD-10-CM | POA: Insufficient documentation

## 2015-11-08 DIAGNOSIS — L409 Psoriasis, unspecified: Secondary | ICD-10-CM | POA: Diagnosis not present

## 2015-11-08 DIAGNOSIS — Z7982 Long term (current) use of aspirin: Secondary | ICD-10-CM | POA: Insufficient documentation

## 2015-11-08 DIAGNOSIS — K219 Gastro-esophageal reflux disease without esophagitis: Secondary | ICD-10-CM | POA: Insufficient documentation

## 2015-11-08 DIAGNOSIS — E785 Hyperlipidemia, unspecified: Secondary | ICD-10-CM | POA: Diagnosis not present

## 2015-11-08 DIAGNOSIS — M4316 Spondylolisthesis, lumbar region: Secondary | ICD-10-CM | POA: Insufficient documentation

## 2015-11-08 DIAGNOSIS — G5601 Carpal tunnel syndrome, right upper limb: Secondary | ICD-10-CM | POA: Diagnosis not present

## 2015-11-08 DIAGNOSIS — Z0183 Encounter for blood typing: Secondary | ICD-10-CM | POA: Insufficient documentation

## 2015-11-08 DIAGNOSIS — M65331 Trigger finger, right middle finger: Secondary | ICD-10-CM | POA: Diagnosis not present

## 2015-11-08 DIAGNOSIS — M65321 Trigger finger, right index finger: Secondary | ICD-10-CM | POA: Diagnosis not present

## 2015-11-08 HISTORY — DX: Effusion, unspecified joint: M25.40

## 2015-11-08 HISTORY — DX: Insomnia, unspecified: G47.00

## 2015-11-08 HISTORY — DX: Unspecified osteoarthritis, unspecified site: M19.90

## 2015-11-08 HISTORY — DX: Gastro-esophageal reflux disease without esophagitis: K21.9

## 2015-11-08 HISTORY — DX: Pain in unspecified joint: M25.50

## 2015-11-08 HISTORY — DX: Hyperlipidemia, unspecified: E78.5

## 2015-11-08 HISTORY — DX: Carpal tunnel syndrome, unspecified upper limb: G56.00

## 2015-11-08 HISTORY — DX: Personal history of colonic polyps: Z86.010

## 2015-11-08 HISTORY — DX: Diverticulosis of intestine, part unspecified, without perforation or abscess without bleeding: K57.90

## 2015-11-08 HISTORY — DX: Personal history of other infectious and parasitic diseases: Z86.19

## 2015-11-08 HISTORY — DX: Psoriasis, unspecified: L40.9

## 2015-11-08 HISTORY — DX: Personal history of colon polyps, unspecified: Z86.0100

## 2015-11-08 HISTORY — DX: Unspecified cataract: H26.9

## 2015-11-08 HISTORY — DX: Weakness: R53.1

## 2015-11-08 HISTORY — DX: Other chronic pain: G89.29

## 2015-11-08 HISTORY — DX: Personal history of other medical treatment: Z92.89

## 2015-11-08 HISTORY — DX: Dorsalgia, unspecified: M54.9

## 2015-11-08 LAB — BASIC METABOLIC PANEL
ANION GAP: 10 (ref 5–15)
BUN: 15 mg/dL (ref 6–20)
CALCIUM: 9.8 mg/dL (ref 8.9–10.3)
CO2: 27 mmol/L (ref 22–32)
Chloride: 106 mmol/L (ref 101–111)
Creatinine, Ser: 0.8 mg/dL (ref 0.44–1.00)
Glucose, Bld: 216 mg/dL — ABNORMAL HIGH (ref 65–99)
Potassium: 4.3 mmol/L (ref 3.5–5.1)
Sodium: 143 mmol/L (ref 135–145)

## 2015-11-08 LAB — SURGICAL PCR SCREEN
MRSA, PCR: NEGATIVE
STAPHYLOCOCCUS AUREUS: NEGATIVE

## 2015-11-08 LAB — GLUCOSE, CAPILLARY: Glucose-Capillary: 132 mg/dL — ABNORMAL HIGH (ref 65–99)

## 2015-11-08 LAB — CBC
HCT: 38.2 % (ref 36.0–46.0)
HEMOGLOBIN: 12.9 g/dL (ref 12.0–15.0)
MCH: 30.4 pg (ref 26.0–34.0)
MCHC: 33.8 g/dL (ref 30.0–36.0)
MCV: 89.9 fL (ref 78.0–100.0)
Platelets: 258 10*3/uL (ref 150–400)
RBC: 4.25 MIL/uL (ref 3.87–5.11)
RDW: 14.2 % (ref 11.5–15.5)
WBC: 9.3 10*3/uL (ref 4.0–10.5)

## 2015-11-08 NOTE — Pre-Procedure Instructions (Signed)
Amy Russo  11/08/2015      [facility] [address] [place], [name] [address] Phone: [phone] Fax: [phone]  [id number] [place] [address] Phone: [phone] Fax: [phone]    Your procedure is scheduled on Fri, Mar 17 @ 9:00 AM  Report to [facility] Admitting at 6:00 AM  Call this number if you have problems the morning of surgery:  [phone]   Remember:  Do not eat food or drink liquids after midnight.  Take these medicines the morning of surgery with A SIP OF WATER Pain Pill(if needed) and Synthroid(Levothyroxine)              Stop taking your Aspirin a week prior to surgery along with Etodolac,Fish Oil,Vitamins,or Herbal Medications. No Goody's,BC's,Aleve,Advil,or Motrin.   How to Manage Your Diabetes Before Surgery   Why is it important to control my blood sugar before and after surgery?   Improving blood sugar levels before and after surgery helps healing and can limit problems.  A way of improving blood sugar control is eating a healthy diet by:  - Eating less sugar and carbohydrates  - Increasing activity/exercise  - Talk with your doctor about reaching your blood sugar goals  High blood sugars (greater than 180 mg/dL) can raise your risk of infections and slow down your recovery so you will need to focus on controlling your diabetes during the weeks before surgery.  Make sure that the doctor who takes care of your diabetes knows about your planned surgery including the date and location.  How do I manage my blood sugars before surgery?   Check your blood sugar at least 4 times a day, 2 days before surgery to make sure that they are not too high or low.   Check your blood sugar the morning of your surgery when you wake up and every 2               hours until you get to the Short-Stay unit.  If your  blood sugar is less than 70 mg/dL, you will need to treat for low blood sugar by:  Treat a low blood sugar (less than 70 mg/dL) with 1/2 cup of clear juice (cranberry or apple), 4 glucose tablets, OR glucose gel.  Recheck blood sugar in 15 minutes after treatment (to make sure it is greater than 70 mg/dL).  If blood sugar is not greater than 70 mg/dL on re-check, call [phone] for further instructions.   Report your blood sugar to the Short-Stay nurse when you get to Short-Stay.  References:  [facility], 2007 "How to Manage your Diabetes Before and After Surgery".  What do I do about my diabetes medications?   Do not take oral diabetes medicines (pills) the morning of surgery.  THE NIGHT BEFORE SURGERY, take 28 units of Lantus Insulin.        Do not take other diabetes injectables the day of surgery including Byetta, Victoza, Bydureon, and Trulicity.    If your CBG is greater than 220 mg/dL, you may take 1/2 of your sliding scale (correction) dose of insulin.   For patients with "Insulin Pumps":  Contact your diabetes doctor for specific instructions before surgery.   Decrease basal insulin rates by 20% at midnight the night before surgery.  Note that if your surgery is planned to be longer than  2 hours, your insulin pump will be removed and intravenous (IV) insulin will be started and managed by the nurses and anesthesiologist.  You will be able to restart your insulin pump once you are awake and able to manage it.  Make sure to bring insulin pump supplies to the hospital with you in case your site needs to be changed.         Do not wear jewelry, make-up or nail polish.  Do not wear lotions, powders, or perfumes.  You may wear deodorant.  Do not shave 48 hours prior to surgery.    Do not bring valuables to the hospital.  [facility] is not responsible for any belongings or valuables.  Contacts, dentures or bridgework may not be worn  into surgery.  Leave your suitcase in the car.  After surgery it may be brought to your room.  For patients admitted to the hospital, discharge time will be determined by your treatment team.  Patients discharged the day of surgery will not be allowed to drive home.    Special instructions:  [place] - Preparing for Surgery  Before surgery, you can play an important role.  Because skin is not sterile, your skin needs to be as free of germs as possible.  You can reduce the number of germs on you skin by washing with CHG (chlorahexidine gluconate) soap before surgery.  CHG is an antiseptic cleaner which kills germs and bonds with the skin to continue killing germs even after washing.  Please DO NOT use if you have an allergy to CHG or antibacterial soaps.  If your skin becomes reddened/irritated stop using the CHG and inform your nurse when you arrive at Short Stay.  Do not shave (including legs and underarms) for at least 48 hours prior to the first CHG shower.  You may shave your face.  Please follow these instructions carefully:   1.  Shower with CHG Soap the night before surgery and the                                morning of Surgery.  2.  If you choose to wash your hair, wash your hair first as usual with your       normal shampoo.  3.  After you shampoo, rinse your hair and body thoroughly to remove the                      Shampoo.  4.  Use CHG as you would any other liquid soap.  You can apply chg directly       to the skin and wash gently with scrungie or a clean washcloth.  5.  Apply the CHG Soap to your body ONLY FROM THE NECK DOWN.        Do not use on open wounds or open sores.  Avoid contact with your eyes,       ears, mouth and genitals (private parts).  Wash genitals (private parts)       with your normal soap.  6.  Wash thoroughly, paying special attention to the area where your surgery        will be performed.  7.  Thoroughly rinse your body with warm water from the neck  down.  8.  DO NOT shower/wash with your normal soap after using and rinsing off       the CHG Soap.  9.  Pat yourself dry with a clean towel.            10.  Wear clean pajamas.            11.  Place clean sheets on your bed the night of your first shower and do not        sleep with pets.  Day of Surgery  Do not apply any lotions/deoderants the morning of surgery.  Please wear clean clothes to the hospital/surgery center.    Please read over the following fact sheets that you were given. Pain Booklet, Coughing and Deep Breathing, Blood Transfusion Information, MRSA Information and Surgical Site Infection Prevention

## 2015-11-08 NOTE — Progress Notes (Signed)
Amy Russo Date of Birth: [birth year] Medical Record [id number]  History of Present Illness: Mrs. Saragosa is seen today for followup of her aortic valve disease. 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 This was a #23 mm Edwards magna pericardial valve. Her last  echocardiogram in September 2016 showed normal prosthetic valve function. She denies any symptoms of chest pain, shortness of breath, or palpitations. She is planning to have lumbar disc surgery on March 17 with Dr Cyndy Freeze. Later this spring she is planning on moving to [facility].   Current Outpatient Prescriptions on File Prior to Visit  Medication Sig Dispense Refill  . amoxicillin (AMOXIL) 500 MG tablet Take 2,000 mg by mouth See admin instructions. TAKE 2000 ONE HOUR BEFORE DENTAL APPT    . aspirin 81 MG tablet Take 81 mg by mouth daily.     Marland Kitchen atorvastatin (LIPITOR) 20 MG tablet Take 1 tablet by mouth daily.  0  . Calcium Carbonate-Vitamin D (CALCIUM + D PO) Take 600-800 mg by mouth daily.    . Cyanocobalamin (VITAMIN B-12 PO) Take 1,000 mg by mouth daily.     Marland Kitchen etodolac (LODINE) 400 MG tablet Take 400 mg by mouth 2 (two) times daily.    . insulin aspart (NOVOLOG) 100 UNIT/ML injection Inject 2-6 Units into the skin 3 (three) times daily as needed for high blood sugar.    . insulin glargine (LANTUS) 100 UNIT/ML injection Inject 35 Units into the skin at bedtime.    Marland Kitchen levothyroxine (SYNTHROID, LEVOTHROID) 75 MCG tablet Take 75 mcg by mouth daily before breakfast.    . Multiple Vitamin (MULTI-VITAMIN PO) Take 1 tablet by mouth daily.     . Omega-3 Fatty Acids (FISH OIL PO) Take 1,200 mg by mouth daily.     Marland Kitchen omeprazole (PRILOSEC) 20 MG capsule Take 20 mg by mouth 2 (two) times daily before a meal.    . Pyridoxine HCl (VITAMIN B-6 PO) Take 200 mg by mouth daily.     . temazepam (RESTORIL) 15 MG capsule Take 1-2 capsules by mouth at bedtime as needed.   2  . Turmeric  500 MG CAPS Take 2 capsules by mouth daily.    Marland Kitchen lisinopril (PRINIVIL,ZESTRIL) 20 MG tablet Take 1 tablet (20 mg total) by mouth daily. 30 tablet 5   No current facility-administered medications on file prior to visit.    Allergies  Allergen Reactions  . Epinephrine Palpitations and Other (See Comments)    Rapid heart beat  . Procaine Other (See Comments)    Increased heart rate    Past Medical History  Diagnosis Date  . 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 Esophageal stricture   . Barrett's esophagus   . Hyperlipidemia     takes Atorvastatin daily  . HTN (hypertension)     takes Lisinopril daily  . Hypothyroidism     takes Synthroid daily  . Insomnia     Takes Restoril daily  . GERD (gastroesophageal reflux disease)     takes Omeprazole daily  . Weakness     numbness and tingling  . Carpal tunnel syndrome     right  . Arthritis   . Joint pain   . Joint swelling   . Chronic back pain     spondylolisthesis/DDD/stenosis  . Psoriasis   . History of colon polyps     benign  . Diverticulosis   .  History of blood transfusion     no abnormal reaction noted  . Diabetes mellitus     takes Novolog and Lantus daily  . Cataract     immature and not sure which eye  . History of shingles     Past Surgical History  Procedure Laterality Date  . Avr  12/2006    #23 Edwards Magna pericardial valve  . Trigger finger release Left   . Tubal ligation    . Axillary artery aneurysm repair Left 1958  . Carpal tunnel release Left   . Mouth surgery  1966  . Tonsillectomy  1955  . Esophagogastroduodenoscopy    . Colonoscopy    . Cyst removed from left ear      History  Smoking status  . Former Smoker  Smokeless tobacco  . Not on file    Comment: quit smoking 30+ yrs ago    History  Alcohol Use  . 0.0 oz/week  . 0 Standard drinks or equivalent per week    Comment: ocassionally    History reviewed. No pertinent family history.  Review of Systems: As  noted in history of present illness. All other systems were reviewed and are negative.  Physical Exam: BP 140/90 mmHg  Pulse 74  Ht 5' 1.5" (1.562 m)  Wt 88.497 kg (195 lb 1.6 oz)  BMI 36.27 kg/m2 She is an obese white female in no acute distress.  The skin is warm and dry.  Color is normal.  The HEENT exam is unremarkable.  The carotids are 2+ without bruits.  There is no thyromegaly.  There is no JVD.  The lungs are clear.   The heart exam reveals a regular rate with a normal S1 and S2.  There are no murmurs, gallops, or rubs.  The PMI is not displaced.   Abdominal exam reveals good bowel sounds.  There is no guarding or rebound.  There is no hepatosplenomegaly or tenderness.  There are no masses.  Exam no ankle edema.  The legs are without rashes.  The distal pulses are intact.  Cranial nerves II - XII are intact. Alert and oriented x3. Motor and sensory functions are intact.  The gait is normal.  LABORATORY DATA:  Echo 05/15/15:  Study Conclusions  - Left ventricle: The cavity size was normal. Systolic function was  normal. The estimated ejection fraction was in the range of 50%  to 55%. Wall motion was normal; there were no regional wall  motion abnormalities. - Aortic valve: Mildly calcified annulus. There was trivial  regurgitation. Valve area (VTI): 1.06 cm^2. Valve area (Vmean):  1.06 cm^2. - Mitral valve: Mildly calcified annulus. - Tricuspid valve: There was moderate regurgitation.  Assessment / Plan: 1. Status post bioprosthetic aortic valve replacement with stable valve function by exam today and Echo. She is cleared for back surgery from a cardiac standpoint. I'll followup in one year. Routine SBE prophylaxis. If she does move to [facility] would encourage her to establish cardiac care there.  2. Diabetes mellitus.  3.  HTN. BP mildly elevated today.  Will observe for now since other readings normal recently.

## 2015-11-08 NOTE — Progress Notes (Addendum)
Cardiologist is Dr.Jordan,clearance in epic from 10/23/15  Medical Md is Hatteras  EKG in epic from 05-09-15  Echo reports in epic from 2008/2015/2016  Stress test report in epic from 2007  Heart cath 2007  CXR to be requested from [facility]

## 2015-11-09 LAB — HEMOGLOBIN A1C
Hgb A1c MFr Bld: 9.3 % — ABNORMAL HIGH (ref 4.8–5.6)
Mean Plasma Glucose: 220 mg/dL

## 2015-11-10 NOTE — Progress Notes (Signed)
Anesthesia Chart Review: Patient is a 65 year old female scheduled for L4-5 decompression with PLIF on 3/17/1/7 by Dr. Christella Noa.  History includes severe AS s/p AVR (pericardial) 12/10/06, former smoker, HLD, hypothyroidism, GERD, HTN, DM2 on insulin, psoriasis, Barrett's esophagus, esophageal stricture s/p dilation '10 and '12, cataracts, tonsillectomy, left axillary artery aneurysm repair. BMI is consistent with obesity. PCP is Dr. Burnard Bunting.  Cardiologist is Dr. Peter Martinique. Per his 11/07/15 office note, "1. Status post bioprosthetic aortic valve replacement with stable valve function by exam today and Echo. She is cleared for back surgery from a cardiac standpoint. I'll followup in one year. Routine SBE prophylaxis. If she does move to [facility] would encourage her to establish cardiac care there."  Meds include ASA 81mg , Lipitor, etodolac, Norco, Novolog, Lantus, levothyroxine, lisinopril, fish oil, Prilosec, pyridoxine, temazepam, turmeric.   05/09/15 EKG: NSR, low voltage QRS, septal infarct (age undetermined).  05/15/15 Echo: Study Conclusions - Left ventricle: The cavity size was normal. Systolic function was  normal. The estimated ejection fraction was in the range of 50%  to 55%. Wall motion was normal; there were no regional wall  motion abnormalities. - Aortic valve: Mildly calcified annulus. There was trivial  regurgitation. Valve area (VTI): 1.06 cm^2. Valve area (Vmean):  1.06 cm^2. - Mitral valve: Mildly calcified annulus. - Tricuspid valve: There was moderate regurgitation.  10/31/05 RHC/LHC (Pre-AVR):  Coronaries: 1. Left main: Normal appearing. 2. LAD: Normal appearing. 3. D1: Large vessel, normal appearing. 4. LCX: Dominant, normal appearing. 5. OM1/OM2: Moderate to large vessel, normal appearing. 6. RCA: Nondominant high branching vessel, normal appearing. 7. LV: EF 60%. No wall motion abnormalities. LVEDP is 16 mmHg. Aortic valve gradient of 14 mmHg  for a peak and 11 for mean, given an aortic valve area of 0.97 cm squared. (S/P AVR 23 mm Edwards pericardial Magna valve by Dr. Cyndia Bent on 12/10/06.)  Preoperative labs noted. A1c 9.3, consistent with mean plasma glucose of 220 (called to Manuela Schwartz at Dr. Lacy Duverney office).  Needs repeat T&S DOS due to antibodies.  If fasting CBG acceptable on the day of surgery and otherwise no acute changes then I anticipate that she can proceed as planned.  George Hugh [facility] Short Stay Center/Anesthesiology Phone [phone] 11/10/2015 11:28 AM

## 2015-11-14 NOTE — Progress Notes (Signed)
Re-requested CXR from Dr.Aronson's office.

## 2015-11-16 MED ORDER — CEFAZOLIN SODIUM-DEXTROSE 2-3 GM-% IV SOLR
2.0000 g | INTRAVENOUS | Status: AC
  Administered 2015-11-17 (×2): 2 g via INTRAVENOUS
  Filled 2015-11-16: qty 50

## 2015-11-17 ENCOUNTER — Encounter (HOSPITAL_COMMUNITY): Payer: Self-pay | Admitting: General Practice

## 2015-11-17 ENCOUNTER — Inpatient Hospital Stay (HOSPITAL_COMMUNITY): Payer: Medicare Other | Admitting: Anesthesiology

## 2015-11-17 ENCOUNTER — Inpatient Hospital Stay (HOSPITAL_COMMUNITY)
Admission: RE | Admit: 2015-11-17 | Discharge: 2015-11-21 | DRG: 460 | Disposition: A | Discharge status: Home / Self Care | Payer: Medicare Other | Source: Ambulatory Visit | Attending: Neurosurgery | Admitting: Neurosurgery

## 2015-11-17 ENCOUNTER — Inpatient Hospital Stay (HOSPITAL_COMMUNITY): Payer: Medicare Other

## 2015-11-17 ENCOUNTER — Inpatient Hospital Stay (HOSPITAL_COMMUNITY): Payer: Medicare Other | Admitting: Vascular Surgery

## 2015-11-17 ENCOUNTER — Encounter (HOSPITAL_COMMUNITY)
Admission: RE | Disposition: A | Discharge status: Home / Self Care | Payer: Self-pay | Source: Ambulatory Visit | Attending: Neurosurgery

## 2015-11-17 DIAGNOSIS — M4806 Spinal stenosis, lumbar region: Secondary | ICD-10-CM | POA: Diagnosis not present

## 2015-11-17 DIAGNOSIS — Z7982 Long term (current) use of aspirin: Secondary | ICD-10-CM | POA: Diagnosis not present

## 2015-11-17 DIAGNOSIS — M549 Dorsalgia, unspecified: Secondary | ICD-10-CM | POA: Diagnosis not present

## 2015-11-17 DIAGNOSIS — Z888 Allergy status to other drugs, medicaments and biological substances status: Secondary | ICD-10-CM

## 2015-11-17 DIAGNOSIS — M4316 Spondylolisthesis, lumbar region: Secondary | ICD-10-CM | POA: Diagnosis present

## 2015-11-17 DIAGNOSIS — K5900 Constipation, unspecified: Secondary | ICD-10-CM | POA: Diagnosis not present

## 2015-11-17 DIAGNOSIS — E119 Type 2 diabetes mellitus without complications: Secondary | ICD-10-CM | POA: Diagnosis present

## 2015-11-17 DIAGNOSIS — I359 Nonrheumatic aortic valve disorder, unspecified: Secondary | ICD-10-CM | POA: Diagnosis present

## 2015-11-17 DIAGNOSIS — K219 Gastro-esophageal reflux disease without esophagitis: Secondary | ICD-10-CM | POA: Diagnosis present

## 2015-11-17 DIAGNOSIS — I10 Essential (primary) hypertension: Secondary | ICD-10-CM | POA: Diagnosis present

## 2015-11-17 DIAGNOSIS — E785 Hyperlipidemia, unspecified: Secondary | ICD-10-CM | POA: Diagnosis present

## 2015-11-17 DIAGNOSIS — Z884 Allergy status to anesthetic agent status: Secondary | ICD-10-CM

## 2015-11-17 DIAGNOSIS — Z79899 Other long term (current) drug therapy: Secondary | ICD-10-CM | POA: Diagnosis not present

## 2015-11-17 DIAGNOSIS — K2270 Barrett's esophagus without dysplasia: Secondary | ICD-10-CM | POA: Diagnosis present

## 2015-11-17 DIAGNOSIS — M4326 Fusion of spine, lumbar region: Secondary | ICD-10-CM | POA: Diagnosis not present

## 2015-11-17 DIAGNOSIS — Z794 Long term (current) use of insulin: Secondary | ICD-10-CM | POA: Diagnosis not present

## 2015-11-17 DIAGNOSIS — E039 Hypothyroidism, unspecified: Secondary | ICD-10-CM | POA: Diagnosis present

## 2015-11-17 LAB — GLUCOSE, CAPILLARY
GLUCOSE-CAPILLARY: 103 mg/dL — AB (ref 65–99)
GLUCOSE-CAPILLARY: 89 mg/dL (ref 65–99)
Glucose-Capillary: 116 mg/dL — ABNORMAL HIGH (ref 65–99)
Glucose-Capillary: 112 mg/dL — ABNORMAL HIGH (ref 65–99)
Glucose-Capillary: 122 mg/dL — ABNORMAL HIGH (ref 65–99)
Glucose-Capillary: 128 mg/dL — ABNORMAL HIGH (ref 65–99)

## 2015-11-17 LAB — TYPE AND SCREEN
ABO/RH(D): O POS
Antibody Screen: POSITIVE

## 2015-11-17 SURGERY — POSTERIOR LUMBAR FUSION 1 LEVEL
Anesthesia: General | Site: Back

## 2015-11-17 MED ORDER — TEMAZEPAM 15 MG PO CAPS
15.0000 mg | ORAL_CAPSULE | Freq: Every evening | ORAL | Status: DC | PRN
Start: 2015-11-17 — End: 2015-11-17

## 2015-11-17 MED ORDER — MENTHOL 3 MG MT LOZG: 1.0000 | LOZENGE | OROMUCOSAL | Status: DC | PRN

## 2015-11-17 MED ORDER — PROPOFOL 10 MG/ML IV BOLUS
INTRAVENOUS | Status: DC | PRN
  Administered 2015-11-17: 120 mg via INTRAVENOUS

## 2015-11-17 MED ORDER — OXYCODONE-ACETAMINOPHEN 5-325 MG PO TABS
1.0000 | ORAL_TABLET | ORAL | Status: DC | PRN
  Administered 2015-11-17 – 2015-11-21 (×14): 2 via ORAL
  Filled 2015-11-17 (×13): qty 2

## 2015-11-17 MED ORDER — SENNOSIDES-DOCUSATE SODIUM 8.6-50 MG PO TABS
1.0000 | ORAL_TABLET | Freq: Every evening | ORAL | Status: DC | PRN
  Administered 2015-11-20: 1 via ORAL
  Filled 2015-11-17: qty 1

## 2015-11-17 MED ORDER — DIAZEPAM 5 MG PO TABS
5.0000 mg | ORAL_TABLET | Freq: Four times a day (QID) | ORAL | Status: DC | PRN
  Administered 2015-11-17 – 2015-11-21 (×10): 5 mg via ORAL
  Filled 2015-11-17 (×9): qty 1

## 2015-11-17 MED ORDER — ONDANSETRON HCL 4 MG/2ML IJ SOLN
INTRAMUSCULAR | Status: DC | PRN
  Administered 2015-11-17: 4 mg via INTRAVENOUS

## 2015-11-17 MED ORDER — LEVOTHYROXINE SODIUM 75 MCG PO TABS
75.0000 ug | ORAL_TABLET | Freq: Every day | ORAL | Status: DC
  Administered 2015-11-18 – 2015-11-21 (×4): 75 ug via ORAL
  Filled 2015-11-17 (×4): qty 1

## 2015-11-17 MED ORDER — ADULT MULTIVITAMIN W/MINERALS CH
ORAL_TABLET | Freq: Every day | ORAL | Status: DC
Start: 2015-11-17 — End: 2015-11-21
  Administered 2015-11-17: 1 via ORAL
  Filled 2015-11-17 (×3): qty 1

## 2015-11-17 MED ORDER — MIDAZOLAM HCL 5 MG/5ML IJ SOLN
INTRAMUSCULAR | Status: DC | PRN
  Administered 2015-11-17: 2 mg via INTRAVENOUS

## 2015-11-17 MED ORDER — PANTOPRAZOLE SODIUM 40 MG PO TBEC
40.0000 mg | DELAYED_RELEASE_TABLET | Freq: Every day | ORAL | Status: DC
  Administered 2015-11-18 – 2015-11-21 (×4): 40 mg via ORAL
  Filled 2015-11-17 (×4): qty 1

## 2015-11-17 MED ORDER — PHENOL 1.4 % MT LIQD: 1.0000 | OROMUCOSAL | Status: DC | PRN

## 2015-11-17 MED ORDER — FENTANYL CITRATE (PF) 250 MCG/5ML IJ SOLN
INTRAMUSCULAR | Status: AC
  Filled 2015-11-17: qty 5

## 2015-11-17 MED ORDER — CALCIUM CARBONATE-VITAMIN D 500-200 MG-UNIT PO TABS
1.0000 | ORAL_TABLET | Freq: Every day | ORAL | Status: DC
  Administered 2015-11-17: 1 via ORAL
  Filled 2015-11-17 (×3): qty 1

## 2015-11-17 MED ORDER — DIAZEPAM 5 MG PO TABS
ORAL_TABLET | ORAL | Status: AC
  Administered 2015-11-17: 5 mg via ORAL
  Filled 2015-11-17: qty 1

## 2015-11-17 MED ORDER — SODIUM CHLORIDE 0.9% FLUSH
3.0000 mL | Freq: Two times a day (BID) | INTRAVENOUS | Status: DC
Start: 2015-11-17 — End: 2015-11-21
  Administered 2015-11-18 – 2015-11-21 (×7): 3 mL via INTRAVENOUS

## 2015-11-17 MED ORDER — PROMETHAZINE HCL 25 MG/ML IJ SOLN: 6.2500 mg | INTRAMUSCULAR | Status: DC | PRN

## 2015-11-17 MED ORDER — LIDOCAINE-EPINEPHRINE 0.5 %-1:200000 IJ SOLN
INTRAMUSCULAR | Status: DC | PRN
  Administered 2015-11-17: 20 mL

## 2015-11-17 MED ORDER — TURMERIC 500 MG PO CAPS: 2.0000 | ORAL_CAPSULE | Freq: Every day | ORAL | Status: DC

## 2015-11-17 MED ORDER — BISACODYL 5 MG PO TBEC
5.0000 mg | DELAYED_RELEASE_TABLET | Freq: Every day | ORAL | Status: DC | PRN
  Administered 2015-11-19 – 2015-11-20 (×2): 5 mg via ORAL
  Filled 2015-11-17 (×2): qty 1

## 2015-11-17 MED ORDER — ONDANSETRON HCL 4 MG/2ML IJ SOLN
4.0000 mg | INTRAMUSCULAR | Status: DC | PRN
  Administered 2015-11-18 – 2015-11-19 (×3): 4 mg via INTRAVENOUS
  Filled 2015-11-17 (×3): qty 2

## 2015-11-17 MED ORDER — CALCIUM CITRATE-VITAMIN D 315-200 MG-UNIT PO TABS: 2.0000 | ORAL_TABLET | Freq: Every day | ORAL | Status: DC

## 2015-11-17 MED ORDER — HYDROMORPHONE HCL 1 MG/ML IJ SOLN
0.2500 mg | INTRAMUSCULAR | Status: DC | PRN
  Administered 2015-11-17 (×4): 0.5 mg via INTRAVENOUS

## 2015-11-17 MED ORDER — HYDROMORPHONE HCL 1 MG/ML IJ SOLN
INTRAMUSCULAR | Status: AC
  Administered 2015-11-17: 0.5 mg via INTRAVENOUS
  Filled 2015-11-17: qty 1

## 2015-11-17 MED ORDER — THROMBIN 20000 UNITS EX SOLR
CUTANEOUS | Status: DC | PRN
  Administered 2015-11-17: 20 mL via TOPICAL

## 2015-11-17 MED ORDER — SODIUM CHLORIDE 0.9% FLUSH
3.0000 mL | INTRAVENOUS | Status: DC | PRN
  Administered 2015-11-18: 3 mL via INTRAVENOUS
  Filled 2015-11-17: qty 3

## 2015-11-17 MED ORDER — ROCURONIUM BROMIDE 50 MG/5ML IV SOLN
INTRAVENOUS | Status: AC
  Filled 2015-11-17: qty 1

## 2015-11-17 MED ORDER — VECURONIUM BROMIDE 10 MG IV SOLR
INTRAVENOUS | Status: AC
  Filled 2015-11-17: qty 10

## 2015-11-17 MED ORDER — VITAMIN B-6 100 MG PO TABS
200.0000 mg | ORAL_TABLET | Freq: Every day | ORAL | Status: DC
  Filled 2015-11-17 (×9): qty 2

## 2015-11-17 MED ORDER — 0.9 % SODIUM CHLORIDE (POUR BTL) OPTIME
TOPICAL | Status: DC | PRN
  Administered 2015-11-17: 1000 mL

## 2015-11-17 MED ORDER — MIDAZOLAM HCL 2 MG/2ML IJ SOLN: 0.5000 mg | Freq: Once | INTRAMUSCULAR | Status: DC | PRN

## 2015-11-17 MED ORDER — NEOSTIGMINE METHYLSULFATE 10 MG/10ML IV SOLN
INTRAVENOUS | Status: DC | PRN
  Administered 2015-11-17: 4 mg via INTRAVENOUS

## 2015-11-17 MED ORDER — VECURONIUM BROMIDE 10 MG IV SOLR
INTRAVENOUS | Status: DC | PRN
  Administered 2015-11-17: 1 mg via INTRAVENOUS
  Administered 2015-11-17 (×2): 2 mg via INTRAVENOUS

## 2015-11-17 MED ORDER — OXYCODONE-ACETAMINOPHEN 5-325 MG PO TABS
ORAL_TABLET | ORAL | Status: AC
  Administered 2015-11-17: 2 via ORAL
  Filled 2015-11-17: qty 2

## 2015-11-17 MED ORDER — VITAMIN B-12 1000 MCG PO TABS
1000.0000 ug | ORAL_TABLET | Freq: Every day | ORAL | Status: DC
Start: 2015-11-17 — End: 2015-11-21
  Filled 2015-11-17 (×3): qty 1

## 2015-11-17 MED ORDER — MEPERIDINE HCL 25 MG/ML IJ SOLN: 6.2500 mg | INTRAMUSCULAR | Status: DC | PRN

## 2015-11-17 MED ORDER — ACETAMINOPHEN 650 MG RE SUPP: 650.0000 mg | RECTAL | Status: DC | PRN

## 2015-11-17 MED ORDER — INSULIN ASPART 100 UNIT/ML ~~LOC~~ SOLN
0.0000 [IU] | Freq: Three times a day (TID) | SUBCUTANEOUS | Status: DC
  Administered 2015-11-17 – 2015-11-18 (×2): 3 [IU] via SUBCUTANEOUS
  Administered 2015-11-19 (×2): 4 [IU] via SUBCUTANEOUS
  Administered 2015-11-20: 11 [IU] via SUBCUTANEOUS
  Administered 2015-11-20: 4 [IU] via SUBCUTANEOUS
  Administered 2015-11-20 – 2015-11-21 (×2): 7 [IU] via SUBCUTANEOUS

## 2015-11-17 MED ORDER — ALBUMIN HUMAN 5 % IV SOLN
INTRAVENOUS | Status: DC | PRN
  Administered 2015-11-17 (×2): via INTRAVENOUS

## 2015-11-17 MED ORDER — PHENYLEPHRINE 40 MCG/ML (10ML) SYRINGE FOR IV PUSH (FOR BLOOD PRESSURE SUPPORT)
PREFILLED_SYRINGE | INTRAVENOUS | Status: AC
  Filled 2015-11-17: qty 10

## 2015-11-17 MED ORDER — SODIUM CHLORIDE 0.9 % IV SOLN
250.0000 mL | INTRAVENOUS | Status: DC
Start: 2015-11-17 — End: 2015-11-21

## 2015-11-17 MED ORDER — GLYCOPYRROLATE 0.2 MG/ML IJ SOLN
INTRAMUSCULAR | Status: DC | PRN
  Administered 2015-11-17: 0.6 mg via INTRAVENOUS

## 2015-11-17 MED ORDER — ASPIRIN 81 MG PO TABS
81.0000 mg | ORAL_TABLET | Freq: Every day | ORAL | Status: DC
  Filled 2015-11-17 (×3): qty 1

## 2015-11-17 MED ORDER — MAGNESIUM CITRATE PO SOLN
1.0000 | Freq: Once | ORAL | Status: AC | PRN
  Administered 2015-11-18: 1 via ORAL
  Filled 2015-11-17: qty 296

## 2015-11-17 MED ORDER — ROCURONIUM BROMIDE 100 MG/10ML IV SOLN
INTRAVENOUS | Status: DC | PRN
  Administered 2015-11-17: 50 mg via INTRAVENOUS

## 2015-11-17 MED ORDER — ATORVASTATIN CALCIUM 10 MG PO TABS
20.0000 mg | ORAL_TABLET | Freq: Every day | ORAL | Status: DC
  Administered 2015-11-17 – 2015-11-20 (×4): 20 mg via ORAL
  Filled 2015-11-17 (×4): qty 2

## 2015-11-17 MED ORDER — INSULIN GLARGINE 100 UNIT/ML ~~LOC~~ SOLN
35.0000 [IU] | Freq: Every day | SUBCUTANEOUS | Status: DC
  Administered 2015-11-17 – 2015-11-20 (×4): 35 [IU] via SUBCUTANEOUS
  Filled 2015-11-17 (×5): qty 0.35

## 2015-11-17 MED ORDER — STERILE WATER FOR INJECTION IJ SOLN
INTRAMUSCULAR | Status: AC
  Filled 2015-11-17: qty 10

## 2015-11-17 MED ORDER — LIDOCAINE HCL (CARDIAC) 20 MG/ML IV SOLN
INTRAVENOUS | Status: AC
  Filled 2015-11-17: qty 5

## 2015-11-17 MED ORDER — HYDROCODONE-ACETAMINOPHEN 5-325 MG PO TABS: 1.0000 | ORAL_TABLET | ORAL | Status: DC | PRN

## 2015-11-17 MED ORDER — OMEGA-3-ACID ETHYL ESTERS 1 G PO CAPS
1.0000 g | ORAL_CAPSULE | Freq: Every day | ORAL | Status: DC
  Administered 2015-11-17 – 2015-11-18 (×2): 1 g via ORAL
  Filled 2015-11-17 (×4): qty 1

## 2015-11-17 MED ORDER — EPHEDRINE SULFATE 50 MG/ML IJ SOLN
INTRAMUSCULAR | Status: AC
  Filled 2015-11-17: qty 1

## 2015-11-17 MED ORDER — HYDROMORPHONE HCL 1 MG/ML IJ SOLN
0.2500 mg | INTRAMUSCULAR | Status: DC | PRN
  Administered 2015-11-17 (×2): 0.25 mg via INTRAVENOUS

## 2015-11-17 MED ORDER — HYDROMORPHONE HCL 1 MG/ML IJ SOLN
INTRAMUSCULAR | Status: AC
  Administered 2015-11-17: 0.25 mg via INTRAVENOUS
  Filled 2015-11-17: qty 1

## 2015-11-17 MED ORDER — CEFAZOLIN SODIUM-DEXTROSE 2-3 GM-% IV SOLR
2.0000 g | Freq: Three times a day (TID) | INTRAVENOUS | Status: AC
Start: 2015-11-17 — End: 2015-11-18
  Administered 2015-11-17 – 2015-11-18 (×2): 2 g via INTRAVENOUS
  Filled 2015-11-17 (×2): qty 50

## 2015-11-17 MED ORDER — PHENYLEPHRINE HCL 10 MG/ML IJ SOLN
INTRAMUSCULAR | Status: DC | PRN
  Administered 2015-11-17: 40 ug via INTRAVENOUS
  Administered 2015-11-17: 80 ug via INTRAVENOUS

## 2015-11-17 MED ORDER — HYDROMORPHONE HCL 1 MG/ML IJ SOLN
0.5000 mg | INTRAMUSCULAR | Status: DC | PRN
  Administered 2015-11-17 – 2015-11-19 (×12): 1 mg via INTRAVENOUS
  Filled 2015-11-17 (×12): qty 1

## 2015-11-17 MED ORDER — SODIUM CHLORIDE 0.9 % IJ SOLN
INTRAMUSCULAR | Status: AC
  Filled 2015-11-17: qty 10

## 2015-11-17 MED ORDER — FENTANYL CITRATE (PF) 250 MCG/5ML IJ SOLN
INTRAMUSCULAR | Status: DC | PRN
  Administered 2015-11-17 (×2): 50 ug via INTRAVENOUS
  Administered 2015-11-17: 250 ug via INTRAVENOUS

## 2015-11-17 MED ORDER — MIDAZOLAM HCL 2 MG/2ML IJ SOLN
INTRAMUSCULAR | Status: AC
  Filled 2015-11-17: qty 2

## 2015-11-17 MED ORDER — ACETAMINOPHEN 325 MG PO TABS: 650.0000 mg | ORAL_TABLET | ORAL | Status: DC | PRN

## 2015-11-17 MED ORDER — LACTATED RINGERS IV SOLN
INTRAVENOUS | Status: DC
Start: 2015-11-17 — End: 2015-11-17
  Administered 2015-11-17 (×3): via INTRAVENOUS

## 2015-11-17 MED ORDER — SUCCINYLCHOLINE CHLORIDE 20 MG/ML IJ SOLN
INTRAMUSCULAR | Status: AC
  Filled 2015-11-17: qty 1

## 2015-11-17 MED ORDER — ZOLPIDEM TARTRATE 5 MG PO TABS: 5.0000 mg | ORAL_TABLET | Freq: Every evening | ORAL | Status: DC | PRN

## 2015-11-17 MED ORDER — PROPOFOL 10 MG/ML IV BOLUS
INTRAVENOUS | Status: AC
  Filled 2015-11-17: qty 20

## 2015-11-17 MED ORDER — SENNA 8.6 MG PO TABS
1.0000 | ORAL_TABLET | Freq: Two times a day (BID) | ORAL | Status: DC
  Administered 2015-11-17 – 2015-11-21 (×8): 8.6 mg via ORAL
  Filled 2015-11-17 (×8): qty 1

## 2015-11-17 MED ORDER — POTASSIUM CHLORIDE IN NACL 20-0.9 MEQ/L-% IV SOLN
INTRAVENOUS | Status: DC
  Administered 2015-11-17: 17:00:00 via INTRAVENOUS
  Filled 2015-11-17 (×10): qty 1000

## 2015-11-17 SURGICAL SUPPLY — 65 items
BENZOIN TINCTURE PRP APPL 2/3 (GAUZE/BANDAGES/DRESSINGS) IMPLANT
BIT DRILL PLIF MAS DISP 5.5MM (DRILL) ×1 IMPLANT
BLADE CLIPPER SURG (BLADE) IMPLANT
BUR MATCHSTICK NEURO 3.0 LAGG (BURR) ×2 IMPLANT
BUR PRECISION FLUTE 5.0 (BURR) ×2 IMPLANT
CAGE BULLET CONCORDE 9X8X23 (Cage) ×4 IMPLANT
CANISTER SUCT 3000ML PPV (MISCELLANEOUS) ×2 IMPLANT
CONT SPEC 4OZ CLIKSEAL STRL BL (MISCELLANEOUS) ×2 IMPLANT
COVER BACK TABLE 60X90IN (DRAPES) ×2 IMPLANT
DECANTER SPIKE VIAL GLASS SM (MISCELLANEOUS) ×2 IMPLANT
DRAPE C-ARM 42X72 X-RAY (DRAPES) ×4 IMPLANT
DRAPE C-ARMOR (DRAPES) IMPLANT
DRAPE LAPAROTOMY 100X72X124 (DRAPES) ×2 IMPLANT
DRAPE POUCH INSTRU U-SHP 10X18 (DRAPES) ×2 IMPLANT
DRAPE SURG 17X23 STRL (DRAPES) ×2 IMPLANT
DRILL PLIF MAS DISP 5.5MM (DRILL) ×2
DRSG OPSITE POSTOP 4X8 (GAUZE/BANDAGES/DRESSINGS) ×2 IMPLANT
DURAPREP 26ML APPLICATOR (WOUND CARE) ×2 IMPLANT
ELECT REM PT RETURN 9FT ADLT (ELECTROSURGICAL) ×2
ELECTRODE REM PT RTRN 9FT ADLT (ELECTROSURGICAL) ×1 IMPLANT
GAUZE SPONGE 4X4 12PLY STRL (GAUZE/BANDAGES/DRESSINGS) IMPLANT
GAUZE SPONGE 4X4 16PLY XRAY LF (GAUZE/BANDAGES/DRESSINGS) IMPLANT
GLOVE ECLIPSE 6.5 STRL STRAW (GLOVE) ×4 IMPLANT
GLOVE EXAM NITRILE LRG STRL (GLOVE) IMPLANT
GLOVE EXAM NITRILE MD LF STRL (GLOVE) IMPLANT
GLOVE EXAM NITRILE XL STR (GLOVE) IMPLANT
GLOVE EXAM NITRILE XS STR PU (GLOVE) IMPLANT
GOWN STRL REUS W/ TWL LRG LVL3 (GOWN DISPOSABLE) ×2 IMPLANT
GOWN STRL REUS W/ TWL XL LVL3 (GOWN DISPOSABLE) IMPLANT
GOWN STRL REUS W/TWL 2XL LVL3 (GOWN DISPOSABLE) IMPLANT
GOWN STRL REUS W/TWL LRG LVL3 (GOWN DISPOSABLE) ×2
GOWN STRL REUS W/TWL XL LVL3 (GOWN DISPOSABLE)
GRAFT BN 5X1XSPNE CVD POST DBM (Bone Implant) ×1 IMPLANT
GRAFT BONE MAGNIFUSE 1X5CM (Bone Implant) ×1 IMPLANT
KIT BASIN OR (CUSTOM PROCEDURE TRAY) ×2 IMPLANT
KIT INFUSE X SMALL 1.4CC (Orthopedic Implant) ×2 IMPLANT
KIT POSITION SURG JACKSON T1 (MISCELLANEOUS) ×2 IMPLANT
KIT ROOM TURNOVER OR (KITS) ×2 IMPLANT
LIQUID BAND (GAUZE/BANDAGES/DRESSINGS) ×4 IMPLANT
MILL MEDIUM DISP (BLADE) ×2 IMPLANT
NEEDLE HYPO 25X1 1.5 SAFETY (NEEDLE) ×2 IMPLANT
NEEDLE SPNL 18GX3.5 QUINCKE PK (NEEDLE) IMPLANT
NS IRRIG 1000ML POUR BTL (IV SOLUTION) ×2 IMPLANT
PACK LAMINECTOMY NEURO (CUSTOM PROCEDURE TRAY) ×2 IMPLANT
PAD ARMBOARD 7.5X6 YLW CONV (MISCELLANEOUS) ×4 IMPLANT
ROD 30MM (Rod) ×1 IMPLANT
ROD 35MM (Rod) ×2 IMPLANT
ROD PLIF MAS PB SPHERX 30 (Rod) ×1 IMPLANT
SCREW LOCK (Screw) ×4 IMPLANT
SCREW LOCK FXNS SPNE MAS PL (Screw) ×4 IMPLANT
SCREW SHANK 5.5X30MM (Screw) ×4 IMPLANT
SCREW SHANKS 5.5X35 (Screw) ×4 IMPLANT
SCREW TULIP 5.5 (Screw) ×8 IMPLANT
SPONGE LAP 4X18 X RAY DECT (DISPOSABLE) IMPLANT
SPONGE SURGIFOAM ABS GEL 100 (HEMOSTASIS) ×2 IMPLANT
STRIP CLOSURE SKIN 1/2X4 (GAUZE/BANDAGES/DRESSINGS) IMPLANT
SUT PROLENE 6 0 BV (SUTURE) IMPLANT
SUT VIC AB 0 CT1 18XCR BRD8 (SUTURE) ×1 IMPLANT
SUT VIC AB 0 CT1 8-18 (SUTURE) ×1
SUT VIC AB 2-0 CT1 18 (SUTURE) ×2 IMPLANT
SUT VIC AB 3-0 SH 8-18 (SUTURE) ×4 IMPLANT
TOWEL OR 17X24 6PK STRL BLUE (TOWEL DISPOSABLE) ×2 IMPLANT
TOWEL OR 17X26 10 PK STRL BLUE (TOWEL DISPOSABLE) ×2 IMPLANT
TRAY FOLEY W/METER SILVER 14FR (SET/KITS/TRAYS/PACK) ×2 IMPLANT
WATER STERILE IRR 1000ML POUR (IV SOLUTION) ×2 IMPLANT

## 2015-11-17 NOTE — Op Note (Signed)
11/17/2015  7:23 PM  PATIENT:  Amy Russo  65 y.o. female with a progressive spondylolisthesis at L4/5 and concomitant stenosis and lateral recess stenosis. She is experiencing significant back and lower extremity pain as a result, and conservative measures have not helped. 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  PROCEDURE:  Procedure(s): Lumbar decompression L4, and the L5 nerve roots beyond the exposure and decompression accomplished with a PLIF PLIF L4/5, with 43mm interbody cages(Synthes-Depuy), morselized auto, and allograft(BMP, very small) Posterolateral arthrodesis L4/5 morselized auto and allograft(Magnafuse, bmp) nonsegmental Pedicle screw fixation L4, L5(Nuvasive mas plif)  SURGEON:  Surgeon(s): Ashok Pall, MD Newman Pies, MD  ASSISTANTS:Jenkins, Dellis Filbert  ANESTHESIA:   general  EBL:     BLOOD ADMINISTERED:none  CELL SAVER GIVEN:none  COUNT:per nursing  DRAINS: none   SPECIMEN:  No Specimen  DICTATION: MARKIYAH MALTER is a 65 y.o. female whom was taken to the operating room intubated, and placed under a general anesthetic without difficulty. A foley catheter was placed under sterile conditions. She was positioned prone on a Jackson table with all pressure points properly padded.  Her lumbar region was prepped and draped in a sterile manner. I infiltrated 20cc's 1/2%lidocaine/1:2000,000 strength epinephrine into the planned incision. I opened the skin with a 10 blade and took the incision down to the thoracolumbar fascia. I exposed the lamina of L3,4,and 5 in a subperiosteal fashion bilaterally. I confirmed my location with an intraoperative xray.  I placed self retaining retractors and started the decompression.  I decompressed the spinal canal at L4/5 via a complete laminectomy of  L4, and bilateral inferior facetectomies of L4, and partial superior facetectomies of L5 bilaterally. This allowed for a complete unroofing of the lateral recess at L4/5 and decompression of the L4 roots. I used the drill, and Kerrison punches to remove the bone. PLIF's were performed at L4/5 in the same fashion. I opened the disc space with a 15 blade then used a variety of instruments to remove the disc and prepare the space for the arthrodesis. I used curettes, rongeurs, punches, shavers for the disc space, and rasps in the discetomy. I measured the disc space and placed 75mm  Peek cages(Depuy) into the disc space(s). I also placed BMP, and morsels of autograft in the disc space anterior and medial to the cages. I decorticated the lateral bone at L4, and 5. I then placed allograft magnafuse and BMP on the decorticated surfaces to complete the posterolateral arthrodesis.  We placed pedicle screws at L4 and 5, using fluoroscopic guidance. I drilled a pilot hole, then cannulated the pedicle with a probe, and drill at each site. We then tapped each pedicle, assessing each site for pedicle violations. No cutouts were appreciated. Screws (Nuvasive mas plif) were then placed at each site without difficulty.  Films were performed and all screws appeared to be in good position. We attached the screw heads to the shanks, and placed the rods within the screw heads. The rods were secured with locking caps to complete the construct. We closed the wound in a layered fashion. We approximated the thoracolumbar fascia, subcutaneous, and subcuticular planes with vicryl sutures. I used dermabond, and an occlusive bandage for a sterile dressing.     PLAN OF CARE: Admit to inpatient   PATIENT DISPOSITION:  PACU - hemodynamically stable.   Delay start of  Pharmacological VTE agent (>24hrs) due to surgical blood loss or risk of bleeding:  yes

## 2015-11-17 NOTE — H&P (Signed)
BP 143/69 mmHg  Pulse 72  Temp(Src) 97.7 F (36.5 C) (Oral)  Resp 20  Ht 5' 1.5" (1.562 m)  Wt 87.499 kg (192 lb 14.4 oz)  BMI 35.86 kg/m2  SpO2 98%  ccBack and right lower extremity pain HPI: Amy Russo is a 65 y.o. female Whom presents with pain in the back and right lower extremity, especially the right thigh since 2010. Amy Russo has had injections since this started, and at this point has stated that she will have no more injections. She has had PT without relief. The pain is worse with walking or standing. She is unable to stand long enough to wash dishes. No bowel or bladder dysfunction. She describes weakness in the lower extremities, and feels at times as if her legs will give.  ROS: Reading glasses, cataracts, hearing aids, hearing loss, tinnitus, nose bleeds, anosmia, hypertension, hypercholesterolemia, selling in the feet, leg pain with walking and at rest, indigestion, arm weakness, leg weakness, back pain, arm pain, diabetes, thyroid disease, memory problems, skin disease, and blood transfusions. Allergies  Allergen Reactions  . Epinephrine Palpitations and Other (See Comments)    Rapid heart beat [Expected]  . Procaine Other (See Comments)    Increased heart rate   Prior to Admission medications   Medication Sig Start Date End Date Taking? Authorizing Provider  amoxicillin (AMOXIL) 500 MG capsule Take 4 capsules by mouth as needed. Prior to dental work 10/30/15  Yes Historical Provider, MD  amoxicillin (AMOXIL) 500 MG tablet Take 2,000 mg by mouth See admin instructions. TAKE 2000 ONE HOUR BEFORE DENTAL APPT   Yes Historical Provider, MD  aspirin 81 MG tablet Take 81 mg by mouth daily.    Yes Historical Provider, MD  atorvastatin (LIPITOR) 20 MG tablet Take 1 tablet by mouth daily. 10/20/14  Yes Historical Provider, MD  Calcium Carbonate-Vitamin D (CALCIUM + D PO) Take 600-800 mg by mouth daily.   Yes Historical Provider, MD  Cyanocobalamin (VITAMIN B-12 PO) Take  1,000 mg by mouth daily.    Yes Historical Provider, MD  etodolac (LODINE) 400 MG tablet Take 400 mg by mouth 2 (two) times daily.   Yes Historical Provider, MD  HYDROcodone-acetaminophen (NORCO/VICODIN) 5-325 MG tablet Take 1 tablet by mouth every 6 (six) hours as needed. for pain 09/20/15  Yes Historical Provider, MD  insulin aspart (NOVOLOG) 100 UNIT/ML injection Inject 2-6 Units into the skin 3 (three) times daily as needed for high blood sugar.   Yes Historical Provider, MD  insulin glargine (LANTUS) 100 UNIT/ML injection Inject 35 Units into the skin at bedtime.   Yes Historical Provider, MD  levothyroxine (SYNTHROID, LEVOTHROID) 75 MCG tablet Take 75 mcg by mouth daily before breakfast.   Yes Historical Provider, MD  lisinopril (PRINIVIL,ZESTRIL) 20 MG tablet Take 1 tablet (20 mg total) by mouth daily. 01/09/11 11/06/15 Yes Peter M Martinique, MD  Multiple Vitamin (MULTI-VITAMIN PO) Take 1 tablet by mouth daily.    Yes Historical Provider, MD  Omega-3 Fatty Acids (FISH OIL PO) Take 1,200 mg by mouth daily.    Yes Historical Provider, MD  omeprazole (PRILOSEC) 20 MG capsule Take 20 mg by mouth 2 (two) times daily before a meal.   Yes Historical Provider, MD  Pyridoxine HCl (VITAMIN B-6 PO) Take 200 mg by mouth daily.    Yes Historical Provider, MD  temazepam (RESTORIL) 15 MG capsule Take 1-2 capsules by mouth at bedtime as needed.  11/14/14  Yes Historical Provider, MD  Turmeric 500  MG CAPS Take 2 capsules by mouth daily.   Yes Historical Provider, MD   Past Medical History  Diagnosis Date  . 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 Esophageal stricture   . Barrett's esophagus   . Hyperlipidemia     takes Atorvastatin daily  . HTN (hypertension)     takes Lisinopril daily  . Hypothyroidism     takes Synthroid daily  . Insomnia     Takes Restoril daily  . GERD (gastroesophageal reflux disease)     takes Omeprazole daily  . Weakness     numbness and tingling  . Carpal tunnel syndrome      right  . Arthritis   . Joint pain   . Joint swelling   . Chronic back pain     spondylolisthesis/DDD/stenosis  . Psoriasis   . History of colon polyps     benign  . Diverticulosis   . History of blood transfusion     no abnormal reaction noted  . Diabetes mellitus     takes Novolog and Lantus daily  . Cataract     immature and not sure which eye  . History of shingles    Past Surgical History  Procedure Laterality Date  . Avr  12/2006    #23 [facility] pericardial valve  . Trigger finger release Left   . Tubal ligation    . Axillary artery aneurysm repair Left 1958  . Carpal tunnel release Left   . Mouth surgery  1966  . Tonsillectomy  1955  . Esophagogastroduodenoscopy    . Colonoscopy    . Cyst removed from left ear     History reviewed. No pertinent family history. Social History   Social History  . Marital Status: Married    Spouse Name: N/A  . Number of Children: 4  . Years of Education: N/A   Occupational History  . Not on file.   Social History Main Topics  . Smoking status: Former Research scientist (life sciences)  . Smokeless tobacco: Not on file     Comment: quit smoking 30+ yrs ago  . Alcohol Use: 0.0 oz/week    0 Standard drinks or equivalent per week     Comment: ocassionally  . Drug Use: No  . Sexual Activity: Yes   Other Topics Concern  . Not on file   Social History Narrative   Physical Exam  Constitutional: She is oriented to person, place, and time. She appears well-developed and well-nourished. She appears distressed.  HENT:  Head: Normocephalic and atraumatic.  Right Ear: External ear normal.  Left Ear: External ear normal.  Eyes: EOM are normal. Pupils are equal, round, and reactive to light.  Neck: Normal range of motion. Neck supple.  Cardiovascular: Normal rate, regular rhythm, normal heart sounds and intact distal pulses.   Pulmonary/Chest: Effort normal and breath sounds normal.  Abdominal: Soft. Bowel sounds are normal.  Musculoskeletal:  Normal range of motion. She exhibits edema.  Neurological: She is alert and oriented to person, place, and time. She has normal reflexes. No cranial nerve deficit. Coordination normal.  Skin: Skin is warm and dry.  Psychiatric: She has a normal mood and affect. Her behavior is normal. Judgment and thought content normal.  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 Her slip has progressed since 2012, as has her facet arthropathy. She has agreed to a lumbar decompression and fusion at L4/5 using instrumentation. Risks including but  not limited to bleeding, infection, no pain relief, need for more surgery, fusion failure, hardware failure, weakness, bowel and or bladder dysfunction, nerve damage, and other risks were discussed. She understands and wishes to proceed.

## 2015-11-17 NOTE — Anesthesia Preprocedure Evaluation (Addendum)
Anesthesia Evaluation  Patient identified by MRN, date of birth, ID band Patient awake    Reviewed: Allergy & Precautions, NPO status , Patient's Chart, lab work & pertinent test results  History of Anesthesia Complications Negative for: history of anesthetic complications  Airway Mallampati: II  TM Distance: >3 FB Neck ROM: Full    Dental  (+) Caps, Dental Advisory Given   Pulmonary COPD, former smoker,    breath sounds clear to auscultation       Cardiovascular hypertension, Pt. on medications (-) angina+ Valvular Problems/Murmurs (s/p AVR: pericardial valve)  Rhythm:Regular Rate:Normal  '16 ECHO: EF 50-55%, normal LVF, AVA 1.06 cm2, trivial AI, mod TR   Neuro/Psych Chronic back pain: narcotics    GI/Hepatic Neg liver ROS, GERD  Medicated and Controlled,  Endo/Other  diabetes (glu 106), Insulin DependentHypothyroidism Morbid obesity  Renal/GU negative Renal ROS     Musculoskeletal  (+) Arthritis , Osteoarthritis,    Abdominal (+) + obese,   Peds  Hematology negative hematology ROS (+)   Anesthesia Other Findings   Reproductive/Obstetrics                           Anesthesia Physical Anesthesia Plan  ASA: III  Anesthesia Plan: General   Post-op Pain Management:    Induction: Intravenous  Airway Management Planned: Oral ETT  Additional Equipment:   Intra-op Plan:   Post-operative Plan: Extubation in OR  Informed Consent: I have reviewed the patients History and Physical, chart, labs and discussed the procedure including the risks, benefits and alternatives for the proposed anesthesia with the patient or authorized representative who has indicated his/her understanding and acceptance.   Dental advisory given  Plan Discussed with: CRNA and Surgeon  Anesthesia Plan Comments: (Plan routine monitors, GETA)        Anesthesia Quick Evaluation

## 2015-11-17 NOTE — Transfer of Care (Signed)
Immediate Anesthesia Transfer of Care Note  Patient: AHONESTI EGBERS  Procedure(s) Performed: Procedure(s) with comments: POSTERIOR LUMBAR FUSION 1 LEVEL (N/A) - L45 decompression with posterior lumbar interbody fusion with interbody prosthesis posterior lateral arthrodesis and posterior nonsegmental instrumentation  Patient Location: PACU  Anesthesia Type:General  Level of Consciousness: awake, alert , oriented and patient cooperative  Airway & Oxygen Therapy: Patient Spontanous Breathing and Patient connected to nasal cannula oxygen  Post-op Assessment: Report given to RN, Post -op Vital signs reviewed and stable and Patient moving all extremities  Post vital signs: Reviewed and stable  Last Vitals:  Filed Vitals:   11/17/15 0724 11/17/15 1329  BP: 143/69   Pulse: 72   Temp: 36.5 C 36.4 C  Resp: 20     Complications: No apparent anesthesia complications

## 2015-11-17 NOTE — Anesthesia Postprocedure Evaluation (Signed)
Anesthesia Post Note  Patient: Amy Russo  Procedure(s) Performed: Procedure(s) (LRB): POSTERIOR LUMBAR FUSION 1 LEVEL (N/A)  Patient location during evaluation: PACU Anesthesia Type: General Level of consciousness: sedated, patient cooperative and oriented Pain management: pain level controlled Vital Signs Assessment: post-procedure vital signs reviewed and stable Respiratory status: spontaneous breathing, nonlabored ventilation, respiratory function stable and patient connected to nasal cannula oxygen Cardiovascular status: blood pressure returned to baseline and stable Postop Assessment: no signs of nausea or vomiting Anesthetic complications: no    Last Vitals:  Filed Vitals:   11/17/15 1430 11/17/15 1445  BP: 163/60 166/64  Pulse: 73   Temp:    Resp: 10 10    Last Pain:  Filed Vitals:   11/17/15 1602  PainSc: Asleep    LLE Motor Response: Purposeful movement;Responds to commands (11/17/15 1600) LLE Sensation: Full sensation (11/17/15 1600) RLE Motor Response: Purposeful movement;Responds to commands (11/17/15 1600) RLE Sensation: Full sensation (11/17/15 1600)      Karmina Zufall,E. Shamecka Hocutt

## 2015-11-17 NOTE — Progress Notes (Signed)
Paged ortho tech for patient's brace. 

## 2015-11-17 NOTE — Progress Notes (Signed)
Patient admitted from PACU. Patient alert and oriented to room and patient made comfortable.

## 2015-11-17 NOTE — Progress Notes (Addendum)
Patient states that her blood sugar was 56 when she woke up this morning and per protocol, patient drank 1/2 cup of apple juice.  Patient rechecked blood sugar after 15 minutes and blood sugar increased to 80.  Upon arrival to short stay, patient's blood sugar 116.  Will continue to monitor patient.

## 2015-11-17 NOTE — Anesthesia Procedure Notes (Signed)
Procedure Name: Intubation Date/Time: 11/17/2015 8:46 AM Performed by: Myna Bright Pre-anesthesia Checklist: Patient identified, Emergency Drugs available, Suction available and Patient being monitored Patient Re-evaluated:Patient Re-evaluated prior to inductionOxygen Delivery Method: Circle system utilized Preoxygenation: Pre-oxygenation with 100% oxygen Intubation Type: IV induction Ventilation: Mask ventilation without difficulty Laryngoscope Size: Mac and 3 Grade View: Grade II Tube type: Oral Tube size: 7.5 mm Number of attempts: 1 Airway Equipment and Method: Stylet Placement Confirmation: ETT inserted through vocal cords under direct vision,  positive ETCO2 and breath sounds checked- equal and bilateral Secured at: 21 cm Tube secured with: Tape Dental Injury: Teeth and Oropharynx as per pre-operative assessment

## 2015-11-18 LAB — GLUCOSE, CAPILLARY
GLUCOSE-CAPILLARY: 86 mg/dL (ref 65–99)
GLUCOSE-CAPILLARY: 76 mg/dL (ref 65–99)
Glucose-Capillary: 110 mg/dL — ABNORMAL HIGH (ref 65–99)
Glucose-Capillary: 133 mg/dL — ABNORMAL HIGH (ref 65–99)

## 2015-11-18 LAB — HEMOGLOBIN A1C
HEMOGLOBIN A1C: 9.1 % — AB (ref 4.8–5.6)
MEAN PLASMA GLUCOSE: 214 mg/dL

## 2015-11-18 MED ORDER — MANAGING BACK PAIN BOOK
Freq: Once | Status: AC
  Administered 2015-11-18: 06:00:00
  Filled 2015-11-18: qty 1

## 2015-11-18 MED ORDER — ASPIRIN EC 81 MG PO TBEC
81.0000 mg | DELAYED_RELEASE_TABLET | Freq: Every day | ORAL | Status: DC
  Administered 2015-11-18 – 2015-11-21 (×4): 81 mg via ORAL
  Filled 2015-11-18 (×4): qty 1

## 2015-11-18 MED ORDER — BISACODYL 10 MG RE SUPP
10.0000 mg | Freq: Every day | RECTAL | Status: DC | PRN
  Administered 2015-11-19: 10 mg via RECTAL
  Filled 2015-11-18: qty 1

## 2015-11-18 MED ORDER — LISINOPRIL 20 MG PO TABS
20.0000 mg | ORAL_TABLET | Freq: Every day | ORAL | Status: DC
  Administered 2015-11-19 – 2015-11-21 (×3): 20 mg via ORAL
  Filled 2015-11-18 (×3): qty 1

## 2015-11-18 NOTE — Progress Notes (Signed)
Orthopedic Tech Progress Note Patient Details:  Amy Russo Jun 22, 1951 [id number]  Patient ID: Amy Russo, female   DOB: 03/08/1951, 65 y.o.   MRN: [id number] Called in [place] b race order; spoke with Dillon Bjork, Raedyn Wenke 11/18/2015, 9:52 AM

## 2015-11-18 NOTE — Progress Notes (Signed)
Patient is complaining of pain, not due for iv meds now, but does not want the tablet at this time. Will continue to monitor.

## 2015-11-18 NOTE — Evaluation (Signed)
Physical Therapy Evaluation Patient Details Name: Amy Russo MRN: [id number] DOB: 04-15-1951 Today's Date: 11/18/2015   History of Present Illness  65 y.o. female s/p PLIF.  Clinical Impression  Patient is s/p above surgery resulting in the deficits listed below (see PT Problem List). On eval, pt required min to min guard assist for functional mobility, including ambulation 100 feet with RW. Patient will benefit from skilled PT to increase their independence and safety with mobility (while adhering to their precautions) to allow discharge to the venue listed below.     Follow Up Recommendations No PT follow up;Supervision - Intermittent    Equipment Recommendations  Rolling walker with 5" wheels    Recommendations for Other Services       Precautions / Restrictions Precautions Precautions: Back Precaution Comments: Educated pt on 3/3 back precautions. Handout provided. Required Braces or Orthoses: Spinal Brace Spinal Brace: Lumbar corset;Applied in sitting position      Mobility  Bed Mobility Overal bed mobility: Needs Assistance Bed Mobility: Rolling;Sidelying to Sit;Sit to Sidelying Rolling: Supervision Sidelying to sit: Min assist;HOB elevated     Sit to sidelying: Min guard General bed mobility comments: verbal cues for logroll, + rail  Transfers Overall transfer level: Needs assistance Equipment used: Rolling walker (2 wheeled) Transfers: Sit to/from [facility] Sit to Stand: Min assist Stand pivot transfers: Min assist       General transfer comment: verbal cues for sequencing and hand placement  Ambulation/Gait Ambulation/Gait assistance: Min guard Ambulation Distance (Feet): 100 Feet Assistive device: Rolling walker (2 wheeled) Gait Pattern/deviations: Wide base of support;Step-through pattern;Decreased stride length Gait velocity: decreased Gait velocity interpretation: Below normal speed for age/gender    Stairs             Wheelchair Mobility    Modified Rankin (Stroke Patients Only)       Balance                                             Pertinent Vitals/Pain Pain Assessment: 0-10 Pain Score: 7  Pain Location: sx site Pain Descriptors / Indicators: Aching Pain Intervention(s): Monitored during session;Limited activity within patient's tolerance;RN gave pain meds during session    [place] expects to be discharged to:: Private residence Living Arrangements: Spouse/significant other Available Help at Discharge: Family;Available 24 hours/day Type of Home: House Home Access: Level entry     Home Layout: One level Home Equipment: None      Prior Function Level of Independence: Independent               Hand Dominance        Extremity/Trunk Assessment                         Communication   Communication: No difficulties  Cognition Arousal/Alertness: Awake/alert Behavior During Therapy: WFL for tasks assessed/performed Overall Cognitive Status: Within Functional Limits for tasks assessed                      General Comments      Exercises        Assessment/Plan    PT Assessment Patient needs continued PT services  PT Diagnosis Difficulty walking;Acute pain   PT Problem List Decreased strength;Decreased activity tolerance;Decreased balance;Decreased mobility;Pain;Decreased knowledge of precautions;Decreased knowledge of use of DME  PT  Treatment Interventions DME instruction;Gait training;Stair training;Functional mobility training;Therapeutic activities;Therapeutic exercise;Patient/family education;Balance training   PT Goals (Current goals can be found in the Care Plan section) Acute Rehab PT Goals Patient Stated Goal: home PT Goal Formulation: With patient Time For Goal Achievement: 12/02/15 Potential to Achieve Goals: Good    Frequency Min 5X/week   Barriers to discharge        Co-evaluation                End of Session Equipment Utilized During Treatment: Gait belt;Back brace Activity Tolerance: Patient tolerated treatment well Patient left: in bed;with family/visitor present;with call bell/phone within reach Nurse Communication: Mobility status         Time: [id number] PT Time Calculation (min) (ACUTE ONLY): 32 min   Charges:   PT Evaluation $PT Eval Moderate Complexity: 1 Procedure PT Treatments $Gait Training: 8-22 mins   PT G Codes:        Lorriane Shire 11/18/2015, 4:00 PM

## 2015-11-18 NOTE — Progress Notes (Signed)
PT Cancellation Note  Patient Details Name: KARISHMA ENDRIS MRN: [id number] DOB: 04/27/1951   Cancelled Treatment:    Reason Eval/Treat Not Completed: Medical issues which prohibited therapy. Awaiting arrival of spinal brace from [facility]. Pt prefers to have brace prior to initiation of mobility. PT to check back this afternoon.   Lorriane Shire 11/18/2015, 11:26 AM

## 2015-11-18 NOTE — Evaluation (Addendum)
Occupational Therapy Evaluation Patient Details Name: Amy Russo MRN: [id number] DOB: Dec 10, 1950 Today's Date: 11/18/2015    History of Present Illness 65 y.o. female s/p POSTERIOR LUMBAR FUSION 1 LEVEL. PMH includes aortic valve disease, esophageal stricture, Barrett's esophagus, hyperlipidemia, HTN, hypothyroidism, insomnia, GERD, weakness, carpal tunnel syndrome, arthritis, DM, shingles, chronic back pain, psoriasis, diverticulosis, joint pain and swelling, AVR, injury in LUE per pt report.    Clinical Impression   Pt s/p above. Pt independent with ADLs, PTA. Feel pt will benefit from acute OT to increase independence prior to d/c. Spouse available to assist upon d/c.    Follow Up Recommendations  No OT follow up;Supervision - Intermittent    Equipment Recommendations  3 in 1 bedside comode;Other (comment) (AE)    Recommendations for Other Services       Precautions / Restrictions Precautions Precautions: Back Precaution Booklet Issued:  (one in room; also wrote down some additional things on it) Precaution Comments: discussed back precautions Required Braces or Orthoses: Spinal Brace Spinal Brace: Lumbar corset;Applied in sitting position Restrictions Weight Bearing Restrictions: No      Mobility Bed Mobility Overal bed mobility: Needs Assistance Bed Mobility: Rolling;Sit to Sidelying;Sidelying to Sit Rolling: Supervision Sidelying to sit: Min assist     Sit to sidelying: Max assist General bed mobility comments: assist with LEs and positioning when going to sidelying position. Cues for technique.  Transfers Overall transfer level: Needs assistance Transfers: Sit to/from Stand Sit to Stand: Min assist       General transfer comment: pt used OT on initial stand from bed. Pt did better standing later in session at supervision level.    Balance      Min guard given for ambulation and for single leg stance while standing.                                       ADL Overall ADL's : Needs assistance/impaired Eating/Feeding: Independent;Sitting               Upper Body Dressing : Sitting;Moderate assistance   Lower Body Dressing: Maximal assistance;Sit to/from stand   Toilet Transfer: Minimal assistance;Ambulation (sit to stand from bed)           Functional mobility during ADLs: Min guard General ADL Comments: Educated on AE. Educated on back brace. Discussed incorporating precautions into functional activities.      Vision     Perception     Praxis      Pertinent Vitals/Pain Pain Assessment: 0-10 Pain Score: 8  Pain Location: back and hips Pain Descriptors / Indicators: Aching Pain Intervention(s): Monitored during session;Repositioned;Limited activity within patient's tolerance (notified nurse of pain)     Hand Dominance Right   Extremity/Trunk Assessment Upper Extremity Assessment Upper Extremity Assessment: LUE deficits/detail LUE Deficits / Details: limited ROM of left shoulder due to previous injury   Lower Extremity Assessment Lower Extremity Assessment: Defer to PT evaluation       Communication Communication Communication: No difficulties   Cognition Arousal/Alertness: Awake/alert Behavior During Therapy: WFL for tasks assessed/performed Overall Cognitive Status: Within Functional Limits for tasks assessed                     General Comments       Exercises       Shoulder Instructions      Home Living Family/patient expects to be discharged to::  Private residence Living Arrangements: Spouse/significant other Available Help at Discharge: Family;Available 24 hours/day Type of Home: House Home Access: Level entry     Home Layout: One level     Bathroom Shower/Tub: Occupational psychologist: Standard     Home Equipment: None          Prior Functioning/Environment Level of Independence: Needs assistance    ADL's / Homemaking Assistance  Needed: assist with dressing, cooking, and cleaning         OT Diagnosis: Acute pain   OT Problem List: Impaired balance (sitting and/or standing);Decreased knowledge of use of DME or AE;Decreased knowledge of precautions;Pain;Impaired UE functional use;Decreased range of motion;Decreased activity tolerance   OT Treatment/Interventions: Self-care/ADL training;DME and/or AE instruction;Therapeutic activities;Patient/family education;Balance training    OT Goals(Current goals can be found in the care plan section) Acute Rehab OT Goals Patient Stated Goal: not stated OT Goal Formulation: With patient Time For Goal Achievement: 11/25/15 Potential to Achieve Goals: Good ADL Goals Pt Will Perform Lower Body Dressing: with set-up;with supervision;with adaptive equipment;sit to/from stand Pt Will Transfer to Toilet: with supervision;ambulating;with set-up (3 in 1 over commode) Pt Will Perform Toileting - Clothing Manipulation and hygiene: sit to/from stand;with set-up Pt Will Perform Tub/Shower Transfer: Shower transfer;with supervision;with set-up;ambulating;rolling walker;3 in 1 Additional ADL Goal #1: Pt will independently verbalize 3/3 back precautions and maintain in session. Additional ADL Goal #2: Caregiver will be independent in assisting pt with bed mobility.  OT Frequency: Min 2X/week   Barriers to D/C:            Co-evaluation              End of Session Equipment Utilized During Treatment: Gait belt;Back brace;Other (comment) (AE) Nurse Communication: Mobility status;Other (comment) (pain)  Activity Tolerance: Patient limited by fatigue;Patient limited by pain Patient left: in bed;with family/visitor present;with SCD's reapplied   Time: 1550-1610 OT Time Calculation (min): 20 min Charges:  OT General Charges $OT Visit: 1 Procedure OT Evaluation $OT Eval Moderate Complexity: 1 Procedure G-CodesBenito Mccreedy OTR/L [id number] 11/18/2015, 4:24 PM

## 2015-11-18 NOTE — Progress Notes (Signed)
Patient ID: Amy Russo, female   DOB: 1951-04-08, 65 y.o.   MRN: [id number] Subjective:  The patient is alert and pleasant. Her back is appropriately sore. She wants to resume her outpatient lisinopril. She complains of constipation. She doesn't have a back brace yet.  Objective: Vital signs in last 24 hours: Temp:  [97.6 F (36.4 C)-98.6 F (37 C)] 98.1 F (36.7 C) (03/18 0641) Pulse Rate:  [68-88] 87 (03/18 0641) Resp:  [10-39] 16 (03/18 0641) BP: (101-188)/(38-98) 124/38 mmHg (03/18 0641) SpO2:  [95 %-100 %] 95 % (03/18 0641) FiO2 (%):  [3 %] 3 % (03/17 1725)  Intake/Output from previous day: 03/17 0701 - 03/18 0700 In: 3718.7 [I.V.:3118.7; IV Piggyback:600] Out: [id number] [Urine:2865; Blood:750] Intake/Output this shift:    Physical exam the patient is alert and pleasant. She is moving her lower extremities well. She looks well.  Lab Results: No results for input(s): WBC, HGB, HCT, PLT in the last 72 hours. BMET No results for input(s): NA, K, CL, CO2, GLUCOSE, BUN, CREATININE, CALCIUM in the last 72 hours.  Studies/Results: Dg Lumbar Spine 2-3 Views  11/17/2015  CLINICAL DATA:  Lumbar fusion. EXAM: DG C-ARM 61-120 MIN; LUMBAR SPINE - 2-3 VIEW COMPARISON:  MRI 10/24/2014. FINDINGS: Fluoroscopic spot images demonstrate pedicle screws and interbody fusion device at L4-5. No complicating features. IMPRESSION: L4-5 fusion hardware in good position without complicating features. Electronically Signed   By: Amy Russo M.D.   On: 11/17/2015 15:49   Dg C-arm 61-120 Min  11/17/2015  CLINICAL DATA:  Lumbar fusion. EXAM: DG C-ARM 61-120 MIN; LUMBAR SPINE - 2-3 VIEW COMPARISON:  MRI 10/24/2014. FINDINGS: Fluoroscopic spot images demonstrate pedicle screws and interbody fusion device at L4-5. No complicating features. IMPRESSION: L4-5 fusion hardware in good position without complicating features. Electronically Signed   By: Amy Russo M.D.   On: 11/17/2015 15:49     Assessment/Plan: Postop day #1: We will get a back brace from [employer]. I'll resume her lisinopril as her blood pressure seems to be slowly creeping up. I'll add Dulcolax suppository for constipation when necessary.  LOS: 1 day     Amy Russo D 11/18/2015, 10:17 AM

## 2015-11-19 LAB — GLUCOSE, CAPILLARY
GLUCOSE-CAPILLARY: 118 mg/dL — AB (ref 65–99)
GLUCOSE-CAPILLARY: 152 mg/dL — AB (ref 65–99)
GLUCOSE-CAPILLARY: 169 mg/dL — AB (ref 65–99)
Glucose-Capillary: 219 mg/dL — ABNORMAL HIGH (ref 65–99)
Glucose-Capillary: 195 mg/dL — ABNORMAL HIGH (ref 65–99)
Glucose-Capillary: 179 mg/dL — ABNORMAL HIGH (ref 65–99)
Glucose-Capillary: 344 mg/dL — ABNORMAL HIGH (ref 65–99)

## 2015-11-19 NOTE — Progress Notes (Signed)
Physical Therapy Treatment Patient Details Name: Amy Russo MRN: [id number] DOB: 1950/09/20 Today's Date: 11/19/2015    History of Present Illness 65 y.o. female s/p POSTERIOR LUMBAR FUSION 1 LEVEL. PMH includes aortic valve disease, esophageal stricture, Barrett's esophagus, hyperlipidemia, HTN, hypothyroidism, insomnia, GERD, weakness, carpal tunnel syndrome, arthritis, DM, shingles, chronic back pain, psoriasis, diverticulosis, joint pain and swelling, AVR, injury in LUE per pt report.     PT Comments    Pt is making steady progress with mobility. Acute PT to continue during pt's hospital stay.  Follow Up Recommendations  No PT follow up;Supervision - Intermittent     Equipment Recommendations  Rolling walker with 5" wheels       Precautions / Restrictions Precautions Precautions: Back Precaution Comments: pt able to recall 2/3 back precautions, reviewed all 3 with pt/spouse Required Braces or Orthoses: Spinal Brace Spinal Brace: Lumbar corset;Applied in sitting position (min assist due to left shoulder pain/injury) Restrictions Weight Bearing Restrictions: No    Mobility  Bed Mobility     Rolling: Min guard Sidelying to sit: Min assist     Sit to sidelying: Min guard General bed mobility comments: bed flat and no rails used. assist needed to transition trunk into sitting position from sidelying position only. cues needed for technique/sequencing to adhere to back precautions.  Transfers Overall transfer level: Needs assistance Equipment used: Rolling walker (2 wheeled) Transfers: Sit to/from Stand Sit to Stand: Min guard         General transfer comment: cues for hand placement with all transfers (to toilet/bed, from bed/toilet) for safety (pt attempting to pull/use walker). no physical assistance needed, increased time needed.  Ambulation/Gait Ambulation/Gait assistance: Min guard;Supervision Ambulation Distance (Feet): 130 Feet Assistive device: Rolling  walker (2 wheeled) Gait Pattern/deviations: Step-through pattern;Wide base of support;Decreased stride length Gait velocity: decreased Gait velocity interpretation: Below normal speed for age/gender General Gait Details: stiff, guarded gait pattern, cues to increase step/stride length with gait.          Cognition   Behavior During Therapy: WFL for tasks assessed/performed Overall Cognitive Status: Within Functional Limits for tasks assessed           Pertinent Vitals/Pain Pain Assessment: 0-10 Pain Score: 7  Pain Location: back Pain Descriptors / Indicators: Operative site guarding Pain Intervention(s): Limited activity within patient's tolerance;Monitored during session;Patient requesting pain meds-RN notified;Repositioned     PT Goals (current goals can now be found in the care plan section) Acute Rehab PT Goals Patient Stated Goal: not stated PT Goal Formulation: With patient Time For Goal Achievement: 12/02/15 Potential to Achieve Goals: Good Progress towards PT goals: Progressing toward goals    Frequency  Min 5X/week    PT Plan Current plan remains appropriate       End of Session Equipment Utilized During Treatment: Gait belt;Back brace Activity Tolerance: Patient tolerated treatment well Patient left: in bed;with family/visitor present;with call bell/phone within reach     Time: 1112-1135 PT Time Calculation (min) (ACUTE ONLY): 23 min  Charges:  $Gait Training: 8-22 mins $Therapeutic Activity: 8-22 mins           Willow Ora 11/19/2015, 11:40 AM     Willow Ora, PTA, [phone] 11/19/2015, 11:41 AM

## 2015-11-19 NOTE — Progress Notes (Signed)
Occupational Therapy Treatment Patient Details Name: PETRA HARTLESS MRN: [id number] DOB: [birth year] Today's Date: 11/19/2015    History of present illness 65 y.o. female s/p POSTERIOR LUMBAR FUSION 1 LEVEL. PMH includes aortic valve disease, esophageal stricture, Barrett's esophagus, hyperlipidemia, HTN, hypothyroidism, insomnia, GERD, weakness, carpal tunnel syndrome, arthritis, DM, shingles, chronic back pain, psoriasis, diverticulosis, joint pain and swelling, AVR, injury in LUE per pt report.    OT comments  Pt progressing. Education provided in session. Plan to see pt for additional OT session tomorrow.  Follow Up Recommendations  No OT follow up;Supervision - Intermittent    Equipment Recommendations  3 in 1 bedside comode;Other (comment) (AE)    Recommendations for Other Services      Precautions / Restrictions Precautions Precautions: Back Precaution Comments: reviewed back precautions Required Braces or Orthoses: Spinal Brace Spinal Brace: Lumbar corset;Applied in sitting position (adjusted in standing position) Restrictions Weight Bearing Restrictions: No       Mobility Bed Mobility Overal bed mobility: Needs Assistance Bed Mobility: Rolling;Sidelying to Sit Rolling: Supervision Sidelying to sit: Min guard     General bed mobility comments: HOB flat and tried to get pt not to use rail, however saw her using rail. Cues for technique for bed mobility.  Transfers Overall transfer level: Needs assistance Equipment used: Rolling walker (2 wheeled) Transfers: Sit to/from Stand Sit to Stand: Min guard         General transfer comment: cues for hand placement.    Balance    Able to perform functional task while standing without LOB.                               ADL Overall ADL's : Needs assistance/impaired                 Upper Body Dressing : Moderate assistance;Sitting;Standing Upper Body Dressing Details (indicate cue type and  reason): back brace and back gown Lower Body Dressing: Moderate assistance;Sit to/from stand;With adaptive equipment Lower Body Dressing Details (indicate cue type and reason): donned panties and donned/doffed sock Toilet Transfer: Min guard;Ambulation (sit to stand from bed)           Functional mobility during ADLs: Min guard;Rolling walker General ADL Comments: Discussed incorporating precautions into functional activities (use of cup for oral care, placement of grooming items). Educated on AE.       Vision                     Perception     Praxis      Cognition  Awake/Alert Behavior During Therapy: WFL for tasks assessed/performed Overall Cognitive Status: Within Functional Limits for tasks assessed                       Extremity/Trunk Assessment               Exercises     Shoulder Instructions       General Comments      Pertinent Vitals/ Pain       Pain Assessment: 0-10 Pain Score: 2  Pain Location: back Pain Descriptors / Indicators: Grimacing Pain Intervention(s): Monitored during session;Repositioned  Home Living  Prior Functioning/Environment              Frequency Min 2X/week     Progress Toward Goals  OT Goals(current goals can now be found in the care plan section)  Progress towards OT goals: Progressing toward goals  Acute Rehab OT Goals Patient Stated Goal: not stated OT Goal Formulation: With patient Time For Goal Achievement: 11/25/15 Potential to Achieve Goals: Good ADL Goals Pt Will Perform Lower Body Dressing: with set-up;with supervision;with adaptive equipment;sit to/from stand Pt Will Transfer to Toilet: with supervision;ambulating;with set-up (3 in 1 over commode) Pt Will Perform Toileting - Clothing Manipulation and hygiene: sit to/from stand;with set-up Pt Will Perform Tub/Shower Transfer: Shower transfer;with supervision;with  set-up;ambulating;rolling walker;3 in 1 Additional ADL Goal #1: Pt will independently verbalize 3/3 back precautions and maintain in session. Additional ADL Goal #2: Caregiver will be independent in assisting pt with bed mobility.  Plan Discharge plan remains appropriate    Co-evaluation                 End of Session Equipment Utilized During Treatment: Gait belt;Rolling walker;Back brace;Other (comment) (AE)   Activity Tolerance Patient tolerated treatment well   Patient Left in chair;with call bell/phone within reach;with family/visitor present   Nurse Communication Other (comment) (notified tech that pt wanted bath)        Time: [id number] OT Time Calculation (min): 22 min  Charges: OT General Charges $OT Visit: 1 Procedure OT Treatments $Self Care/Home Management : 8-22 mins  Benito Mccreedy OTR/L [id number] 11/19/2015, 2:13 PM

## 2015-11-19 NOTE — Progress Notes (Signed)
Patient ID: Amy Russo, female   DOB: 01-07-1951, 65 y.o.   MRN: [id number] Subjective:  Is alert and pleasant. Her back is appropriately sore. She complains of constipation.  Objective: Vital signs in last 24 hours: Temp:  [98.5 F (36.9 C)-100.7 F (38.2 C)] 98.5 F (36.9 C) (03/19 0825) Pulse Rate:  [91-106] 101 (03/19 0021) Resp:  [16-18] 18 (03/19 0021) BP: (111-137)/(42-58) 136/57 mmHg (03/19 0021) SpO2:  [95 %-100 %] 97 % (03/19 0021)  Intake/Output from previous day:   Intake/Output this shift:    Physical exam the patient is alert and pleasant. She is moving her lower extremities well.  Lab Results: No results for input(s): WBC, HGB, HCT, PLT in the last 72 hours. BMET No results for input(s): NA, K, CL, CO2, GLUCOSE, BUN, CREATININE, CALCIUM in the last 72 hours.  Studies/Results: Dg Lumbar Spine 2-3 Views  11/17/2015  CLINICAL DATA:  Lumbar fusion. EXAM: DG C-ARM 61-120 MIN; LUMBAR SPINE - 2-3 VIEW COMPARISON:  MRI 10/24/2014. FINDINGS: Fluoroscopic spot images demonstrate pedicle screws and interbody fusion device at L4-5. No complicating features. IMPRESSION: L4-5 fusion hardware in good position without complicating features. Electronically Signed   By: Marijo Sanes M.D.   On: 11/17/2015 15:49   Dg C-arm 61-120 Min  11/17/2015  CLINICAL DATA:  Lumbar fusion. EXAM: DG C-ARM 61-120 MIN; LUMBAR SPINE - 2-3 VIEW COMPARISON:  MRI 10/24/2014. FINDINGS: Fluoroscopic spot images demonstrate pedicle screws and interbody fusion device at L4-5. No complicating features. IMPRESSION: L4-5 fusion hardware in good position without complicating features. Electronically Signed   By: Marijo Sanes M.D.   On: 11/17/2015 15:49    Assessment/Plan: Postop day #2: We'll continue to mobilize the patient with PT. We will give her a Dulcolax suppository for her constipation. I answered all the patient's, and husbands, questions.  LOS: 2 days     Amy Russo D 11/19/2015, 9:21  AM

## 2015-11-20 LAB — GLUCOSE, CAPILLARY
GLUCOSE-CAPILLARY: 170 mg/dL — AB (ref 65–99)
Glucose-Capillary: 265 mg/dL — ABNORMAL HIGH (ref 65–99)
Glucose-Capillary: 201 mg/dL — ABNORMAL HIGH (ref 65–99)
Glucose-Capillary: 233 mg/dL — ABNORMAL HIGH (ref 65–99)

## 2015-11-20 MED ORDER — FLEET ENEMA 7-19 GM/118ML RE ENEM
1.0000 | ENEMA | Freq: Every day | RECTAL | Status: DC | PRN
  Administered 2015-11-20: 1 via RECTAL
  Filled 2015-11-20: qty 1

## 2015-11-20 MED ORDER — OXYCODONE-ACETAMINOPHEN 5-325 MG PO TABS: 1.0000 | ORAL_TABLET | Freq: Four times a day (QID) | ORAL | Status: AC | PRN

## 2015-11-20 MED ORDER — TIZANIDINE HCL 4 MG PO TABS: 4.0000 mg | ORAL_TABLET | Freq: Four times a day (QID) | ORAL | Status: AC | PRN

## 2015-11-20 NOTE — Care Management Note (Signed)
Case Management Note  Patient Details  Name: Amy Russo MRN: [id number] Date of Birth: 1950-10-09  Subjective/Objective:                    Action/Plan: Patient was admitted for a PLIF. Lives at home with husband. Will follow for discharge needs pending PT/OT evals and physician orders.  Expected Discharge Date:                  Expected Discharge Plan:     In-House Referral:     Discharge planning Services     Post Acute Care Choice:    Choice offered to:     DME Arranged:    DME Agency:     HH Arranged:    HH Agency:     Status of Service:  In process, will continue to follow  Medicare Important Message Given:    Date Medicare IM Given:    Medicare IM give by:    Date Additional Medicare IM Given:    Additional Medicare Important Message give by:     If discussed at Early of Stay Meetings, dates discussed:    Additional Comments:  Rolm Baptise, RN 11/20/2015, 10:57 AM [phone]

## 2015-11-20 NOTE — Progress Notes (Signed)
Occupational Therapy Treatment Patient Details Name: Amy Russo MRN: [id number] DOB: 1951-01-29 Today's Date: 11/20/2015    History of present illness 65 y.o. female s/p POSTERIOR LUMBAR FUSION 1 LEVEL. PMH includes aortic valve disease, esophageal stricture, Barrett's esophagus, hyperlipidemia, HTN, hypothyroidism, insomnia, GERD, weakness, carpal tunnel syndrome, arthritis, DM, shingles, chronic back pain, psoriasis, diverticulosis, joint pain and swelling, AVR, injury in LUE per pt report.    OT comments  Pt progressing. Education provided in session. Pt would like another session tomorrow to review prior to d/c home.  Follow Up Recommendations  No OT follow up;Supervision - Intermittent    Equipment Recommendations  3 in 1 bedside commode; AE if wanted    Recommendations for Other Services      Precautions / Restrictions Precautions Precautions: Fall;Back Precaution Booklet Issued: No Required Braces or Orthoses: Spinal Brace Spinal Brace: Lumbar corset (already on in session) Restrictions Weight Bearing Restrictions: No       Mobility Bed Mobility               General bed mobility comments: verbally reviewed with pt.  Transfers Overall transfer level: Needs assistance   Transfers: Sit to/from Stand Sit to Stand: Supervision (and set up for RW)         General transfer comment: RW in front of pt    Balance     Min assist for simulated shower transfer without RW.                               ADL Overall ADL's : Needs assistance/impaired                         Toilet Transfer: Supervision/safety;Ambulation;RW (also set up for RW); sit to stand from chair       Tub/ Shower Transfer: Minimal assistance;Ambulation;Walk-in shower   Functional mobility during ADLs: Supervision/safety with Rolling walker for ambulation (and set up for RW prior to stand); Min assist for simulated shower transfer General ADL Comments:  Discussed incorporating precautions into functional activities. Educated on car transfer technique. Educated on shower transfer technique. Educated on safety. Pt not interested in practicing with AE at this time-spouse planning to assist. Educated on what pt could use for toilet aid.      Vision                     Perception     Praxis      Cognition  Awake/Alert Behavior During Therapy: WFL for tasks assessed/performed Overall Cognitive Status: Within Functional Limits for tasks assessed                       Extremity/Trunk Assessment               Exercises     Shoulder Instructions       General Comments      Pertinent Vitals/ Pain       Pain Assessment: 0-10 Pain Score: 5  Pain Location: back at beginning of session in chair Pain Intervention(s): Monitored during session  Home Living                                          Prior Functioning/Environment  Frequency Min 2X/week     Progress Toward Goals  OT Goals(current goals can now be found in the care plan section)  Progress towards OT goals: Progressing toward goals  Acute Rehab OT Goals Patient Stated Goal: not stated OT Goal Formulation: With patient Time For Goal Achievement: 11/25/15 Potential to Achieve Goals: Good ADL Goals Pt Will Perform Lower Body Dressing: with set-up;with supervision;with adaptive equipment;sit to/from stand  Pt Will Perform Toileting - Clothing Manipulation and hygiene: sit to/from stand;with set-up Pt Will Perform Tub/Shower Transfer: Shower transfer;with supervision;with set-up;ambulating;rolling walker;3 in 1 Additional ADL Goal #1: Pt will independently verbalize 3/3 back precautions and maintain in session. Additional ADL Goal #2: Caregiver will be independent in assisting pt with bed mobility.  Plan Discharge plan remains appropriate    Co-evaluation                 End of Session Equipment Utilized  During Treatment: Rolling walker;Back brace   Activity Tolerance Patient tolerated treatment well   Patient Left in chair;with call bell/phone within reach   Nurse Communication          Time: 1343-1401 OT Time Calculation (min): 18 min  Charges: OT General Charges $OT Visit: 1 Procedure OT Treatments $Self Care/Home Management : 8-22 mins  Benito Mccreedy OTR/L [id number] 11/20/2015, 4:50 PM

## 2015-11-20 NOTE — Discharge Summary (Signed)
Physician Discharge Summary  Patient ID: Amy Russo MRN: [id number] DOB/AGE: 1950-10-10 65 y.o.  Admit date: 11/17/2015 Discharge date: 11/20/2015  Admission Diagnoses:lumbar spondylolisthesis L4/5, Lumbar stenosis L4/5   Discharge Diagnoses: lumbar spondylolisthesis L4/5, Lumbar stenosis L4/5  Active Problems:   Spondylolisthesis of lumbar region   Discharged Condition: good  Hospital Course: Amy Russo was admitted and taken to the hospital for an uncomplicated lumbAr decompression and arthrodesis. Post op she was seen and treated by both OT, and PT. She is cleared to go home by both. Her wound is clean, dry, and without signs of infection. At discharge she is ambulating, voiding, and tolerating a regular diet. Amy Russo is moving all extremities well.  Treatments: surgery: Lumbar decompression L4, and the L5 nerve roots beyond the exposure and decompression accomplished with a PLIF PLIF L4/5, with 98mm interbody cages(Synthes-Depuy), morselized auto, and allograft(BMP, very small) Posterolateral arthrodesis L4/5 morselized auto and allograft(Magnafuse, bmp) nonsegmental Pedicle screw fixation L4, L5(Nuvasive mas plif)   Discharge Exam: Blood pressure 148/59, pulse 91, temperature 98.8 F (37.1 C), temperature source Oral, resp. rate 18, height 5' 1.5" (1.562 m), weight 87.499 kg (192 lb 14.4 oz), SpO2 100 %. General appearance: alert, cooperative and appears stated age  Disposition: 01-Home or Self Care lumbar spondylolisthesis Discharge Instructions    For home use only DME Bedside commode    Complete by:  As directed      Shower chair    Complete by:  As directed      Walker rolling    Complete by:  As directed             Medication List    STOP taking these medications        HYDROcodone-acetaminophen 5-325 MG tablet  Commonly known as:  NORCO/VICODIN      TAKE these medications        amoxicillin 500 MG tablet  Commonly known as:  AMOXIL  Take  2,000 mg by mouth See admin instructions. TAKE 2000 ONE HOUR BEFORE DENTAL APPT     amoxicillin 500 MG capsule  Commonly known as:  AMOXIL  Take 4 capsules by mouth as needed. Prior to dental work     aspirin 81 MG tablet  Take 81 mg by mouth daily.     atorvastatin 20 MG tablet  Commonly known as:  LIPITOR  Take 1 tablet by mouth daily.     CALCIUM + D PO  Take 600-800 mg by mouth daily.     etodolac 400 MG tablet  Commonly known as:  LODINE  Take 400 mg by mouth 2 (two) times daily.     FISH OIL PO  Take 1,200 mg by mouth daily.     insulin aspart 100 UNIT/ML injection  Commonly known as:  novoLOG  Inject 2-6 Units into the skin 3 (three) times daily as needed for high blood sugar.     insulin glargine 100 UNIT/ML injection  Commonly known as:  LANTUS  Inject 35 Units into the skin at bedtime.     levothyroxine 75 MCG tablet  Commonly known as:  SYNTHROID, LEVOTHROID  Take 75 mcg by mouth daily before breakfast.     lisinopril 20 MG tablet  Commonly known as:  PRINIVIL,ZESTRIL  Take 1 tablet (20 mg total) by mouth daily.     MULTI-VITAMIN PO  Take 1 tablet by mouth daily.     omeprazole 20 MG capsule  Commonly known as:  PRILOSEC  Take 20 mg  by mouth 2 (two) times daily before a meal.     oxyCODONE-acetaminophen 5-325 MG tablet  Commonly known as:  PERCOCET/ROXICET  Take 1-2 tablets by mouth every 6 (six) hours as needed for moderate pain.     temazepam 15 MG capsule  Commonly known as:  RESTORIL  Take 1-2 capsules by mouth at bedtime as needed.     tiZANidine 4 MG tablet  Commonly known as:  ZANAFLEX  Take 1 tablet (4 mg total) by mouth every 6 (six) hours as needed for muscle spasms.     Turmeric 500 MG Caps  Take 2 capsules by mouth daily.     VITAMIN B-12 PO  Take 1,000 mcg by mouth daily.     VITAMIN B-6 PO  Take 200 mg by mouth daily.           Follow-up Information    Follow up with Anthonia Monger L, MD In 3 weeks.   Specialty:   Neurosurgery   Why:  please call the office to make an appointment   Contact information:   [address] [place] [address] [phone]       Signed: Winfield Cunas 11/20/2015, 7:16 PM

## 2015-11-20 NOTE — Care Management Important Message (Signed)
Important Message  Patient Details  Name: Amy Russo MRN: [id number] Date of Birth: April 03, 1951   Medicare Important Message Given:  Yes    Barb Merino Amelita Risinger 11/20/2015, 3:48 PM

## 2015-11-20 NOTE — Progress Notes (Signed)
Patient and her husband are concerned that he is not physically able (he is having c-spine issues) to care for wife at time of discharge and would like to see if other options are available.

## 2015-11-20 NOTE — Progress Notes (Signed)
Physical Therapy Treatment Patient Details Name: Amy Russo MRN: [id number] DOB: Jun 27, 1951 Today's Date: 11/20/2015    History of Present Illness 65 y.o. female s/p POSTERIOR LUMBAR FUSION 1 LEVEL. PMH includes aortic valve disease, esophageal stricture, Barrett's esophagus, hyperlipidemia, HTN, hypothyroidism, insomnia, GERD, weakness, carpal tunnel syndrome, arthritis, DM, shingles, chronic back pain, psoriasis, diverticulosis, joint pain and swelling, AVR, injury in LUE per pt report.     PT Comments    Pt progressing towards physical therapy goals. Was able to perform transfers and ambulation with min guard to supervision for safety. Pt and husband had many questions regarding safety with mobility during home tasks, and time was spent on pt/family education on car transfer, DME recommendations, back precautions, and beginning a walking program. Pt requesting to practice the stairs prior to d/c although she does not have stairs to enter her home.  Follow Up Recommendations  No PT follow up;Supervision - Intermittent     Equipment Recommendations  Rolling walker with 5" wheels;3in1 (PT)    Recommendations for Other Services       Precautions / Restrictions Precautions Precautions: Fall;Back Precaution Booklet Issued: Yes (comment) Precaution Comments: Reviewed handout and pt was cued for precautions during functional mobility.  Required Braces or Orthoses: Spinal Brace Spinal Brace: Lumbar corset;Applied in sitting position Restrictions Weight Bearing Restrictions: No    Mobility  Bed Mobility Overal bed mobility: Needs Assistance Bed Mobility: Rolling;Sidelying to Sit Rolling: Supervision Sidelying to sit: Supervision       General bed mobility comments: HOB flat and rails lowered to simulate home environment. Pt able to achieve full sitting position without hands-on assist.   Transfers Overall transfer level: Needs assistance Equipment used: Rolling walker (2  wheeled) Transfers: Sit to/from Stand Sit to Stand: Min guard         General transfer comment: VC's for hand placement on seated surface for safety  Ambulation/Gait Ambulation/Gait assistance: Min guard;Supervision Ambulation Distance (Feet): 200 Feet Assistive device: Rolling walker (2 wheeled) Gait Pattern/deviations: Step-through pattern;Decreased stride length;Trunk flexed Gait velocity: decreased Gait velocity interpretation: Below normal speed for age/gender General Gait Details: Slow and guarded, but generally steady with use of RW for support. Pt required encouragement to gain distance.   Stairs            Wheelchair Mobility    Modified Rankin (Stroke Patients Only)       Balance Overall balance assessment: Needs assistance Sitting-balance support: Feet supported;No upper extremity supported Sitting balance-Leahy Scale: Fair     Standing balance support: Bilateral upper extremity supported;During functional activity Standing balance-Leahy Scale: Poor Standing balance comment: Requires UE support for dynamic standing activity.                     Cognition Arousal/Alertness: Awake/alert Behavior During Therapy: WFL for tasks assessed/performed Overall Cognitive Status: Within Functional Limits for tasks assessed                      Exercises      General Comments        Pertinent Vitals/Pain Pain Assessment: 0-10 Pain Score: 7  Pain Location: Back during ambulation Pain Descriptors / Indicators: Operative site guarding;Aching Pain Intervention(s): Monitored during session;Limited activity within patient's tolerance;Repositioned    Home Living                      Prior Function            PT  Goals (current goals can now be found in the care plan section) Acute Rehab PT Goals Patient Stated Goal: not stated PT Goal Formulation: With patient/family Time For Goal Achievement: 12/02/15 Potential to Achieve Goals:  Good Progress towards PT goals: Progressing toward goals    Frequency  Min 5X/week    PT Plan Current plan remains appropriate    Co-evaluation             End of Session Equipment Utilized During Treatment: Gait belt;Back brace Activity Tolerance: Patient tolerated treatment well Patient left: with family/visitor present;with call bell/phone within reach;in chair     Time: [id number] PT Time Calculation (min) (ACUTE ONLY): 36 min  Charges:  $Gait Training: 23-37 mins                    G Codes:      Rolinda Roan 11-30-15, 12:27 PM   Rolinda Roan, PT, DPT Acute Rehabilitation Services Pager: [phone]

## 2015-11-20 NOTE — Progress Notes (Signed)
Patient ID: Amy Russo, female   DOB: 1951-06-11, 65 y.o.   MRN: [id number] BP 148/59 mmHg  Pulse 91  Temp(Src) 98.8 F (37.1 C) (Oral)  Resp 18  Ht 5' 1.5" (1.562 m)  Wt 87.499 kg (192 lb 14.4 oz)  BMI 35.86 kg/m2  SpO2 100% Alert and oriented x 4 Moving all extremities well Wound is clean, dry, and without signs of infection Will discharge tomorrow.

## 2015-11-20 NOTE — Discharge Instructions (Signed)

## 2015-11-21 LAB — GLUCOSE, CAPILLARY: GLUCOSE-CAPILLARY: 209 mg/dL — AB (ref 65–99)

## 2015-11-21 NOTE — Progress Notes (Signed)
Occupational Therapy Treatment Patient Details Name: Amy Russo MRN: [id number] DOB: 05/27/1951 Today's Date: 11/21/2015    History of present illness 64 y.o. female s/p POSTERIOR LUMBAR FUSION 1 LEVEL. PMH includes aortic valve disease, esophageal stricture, Barrett's esophagus, hyperlipidemia, HTN, hypothyroidism, insomnia, GERD, weakness, carpal tunnel syndrome, arthritis, DM, shingles, chronic back pain, psoriasis, diverticulosis, joint pain and swelling, AVR, injury in LUE per pt report.    OT comments  Reviewed all education.  NO further OT needs  Follow Up Recommendations  No OT follow up;Supervision - Intermittent    Equipment Recommendations  3 in 1 bedside comode    Recommendations for Other Services      Precautions / Restrictions Precautions Precautions: Fall;Back Precaution Booklet Issued: No Required Braces or Orthoses: Spinal Brace Spinal Brace: Lumbar corset Restrictions Weight Bearing Restrictions: No       Mobility Bed Mobility               General bed mobility comments: oob  Transfers     Transfers: Sit to/from Stand Sit to Stand: Supervision         General transfer comment: RW in front of pt    Balance                                   ADL                           Toilet Transfer: Supervision/safety;Ambulation;RW (to recliner)             General ADL Comments: Pt feels comfortable with bed mobility and toilet transfer. Reviewed shower but husband preferred pt not practice at this time due to having taken valium earlier.  Reviewed sequence with them and discussed placement of 3:1.  Reviewed precautions. Pt took a short walk in hall when her walker was delivered with supervision.  Pt does not want any AE--showed toilet aide, in case she has difficulty at home      Vision                     Perception     Praxis      Cognition   Behavior During Therapy: [facility] for tasks  assessed/performed Overall Cognitive Status: Within Functional Limits for tasks assessed                       Extremity/Trunk Assessment               Exercises     Shoulder Instructions       General Comments      Pertinent Vitals/ Pain       Pain Score: 4  Pain Location: back; c/o itchy all over Pain Intervention(s): Limited activity within patient's tolerance;Monitored during session  Home Living                                          Prior Functioning/Environment              Frequency       Progress Toward Goals  OT Goals(current goals can now be found in the care plan section)  Progress towards OT goals: Progressing toward goals (plans d/c today; does not need follow up OT)  Plan      Co-evaluation                 End of Session Equipment Utilized During Treatment: Rolling walker;Back brace   Activity Tolerance Patient tolerated treatment well   Patient Left in chair;with call bell/phone within reach   Nurse Communication          Time: [id number] OT Time Calculation (min): 16 min  Charges: OT General Charges $OT Visit: 1 Procedure OT Treatments $Self Care/Home Management : 8-22 mins  Carlis Blanchard 11/21/2015, 11:06 AM Lesle Chris, OTR/L [phone] 11/21/2015

## 2015-11-21 NOTE — Care Management Note (Signed)
Case Management Note  Patient Details  Name: Amy Russo MRN: [id number] Date of Birth: 06-24-1951  Subjective/Objective:                    Action/Plan: Patient discharging home today with self care. Orders for 3 in 1, walker, tub bench. Medicare will not cover a tub bench, patient informed and would rather not receive this equipment. Jermaine with [facility] informed of the walker and 3 in 1 and will deliver the equipment to the room. Bedside RN updated.  Expected Discharge Date:                  Expected Discharge Plan:     In-House Referral:     Discharge planning Services     Post Acute Care Choice:    Choice offered to:     DME Arranged:    DME Agency:     HH Arranged:    HH Agency:     Status of Service:  In process, will continue to follow  Medicare Important Message Given:  Yes Date Medicare IM Given:    Medicare IM give by:    Date Additional Medicare IM Given:    Additional Medicare Important Message give by:     If discussed at [place] of Stay Meetings, dates discussed:    Additional Comments:  Pollie Friar, RN 11/21/2015, 10:24 AM

## 2015-11-21 NOTE — Progress Notes (Signed)
Patient is discharged from room 5C21 at this time. Alert and in stable condition. IV site d/c'd ans instructions read to patient and husband with understanding verbalized. Left unit via wheelchair with all belongings at side.

## 2015-11-22 LAB — POCT I-STAT 4, (NA,K, GLUC, HGB,HCT)
GLUCOSE: 111 mg/dL — AB (ref 65–99)
HEMATOCRIT: 27 % — AB (ref 36.0–46.0)
HEMOGLOBIN: 9.2 g/dL — AB (ref 12.0–15.0)
POTASSIUM: 3.6 mmol/L (ref 3.5–5.1)
SODIUM: 140 mmol/L (ref 135–145)

## 2015-11-23 LAB — TYPE AND SCREEN
ABO/RH(D): O POS
ANTIBODY SCREEN: POSITIVE
DAT, IGG: NEGATIVE
DONOR AG TYPE: NEGATIVE
Donor AG Type: NEGATIVE
PT AG TYPE: NEGATIVE
UNIT DIVISION: 0
UNIT DIVISION: 0

## 2015-12-07 DIAGNOSIS — I10 Essential (primary) hypertension: Secondary | ICD-10-CM | POA: Diagnosis not present

## 2015-12-07 DIAGNOSIS — Z6839 Body mass index (BMI) 39.0-39.9, adult: Secondary | ICD-10-CM | POA: Diagnosis not present

## 2015-12-07 DIAGNOSIS — M5416 Radiculopathy, lumbar region: Secondary | ICD-10-CM | POA: Diagnosis not present

## 2015-12-07 DIAGNOSIS — M4316 Spondylolisthesis, lumbar region: Secondary | ICD-10-CM | POA: Diagnosis not present

## 2015-12-29 DIAGNOSIS — K210 Gastro-esophageal reflux disease with esophagitis: Secondary | ICD-10-CM | POA: Diagnosis not present

## 2015-12-29 DIAGNOSIS — K2270 Barrett's esophagus without dysplasia: Secondary | ICD-10-CM | POA: Diagnosis not present

## 2015-12-29 DIAGNOSIS — R198 Other specified symptoms and signs involving the digestive system and abdomen: Secondary | ICD-10-CM | POA: Diagnosis not present

## 2015-12-29 DIAGNOSIS — Z800 Family history of malignant neoplasm of digestive organs: Secondary | ICD-10-CM | POA: Diagnosis not present

## 2016-01-09 DIAGNOSIS — E113213 Type 2 diabetes mellitus with mild nonproliferative diabetic retinopathy with macular edema, bilateral: Secondary | ICD-10-CM | POA: Diagnosis not present

## 2016-01-09 DIAGNOSIS — H2513 Age-related nuclear cataract, bilateral: Secondary | ICD-10-CM | POA: Diagnosis not present

## 2016-01-09 DIAGNOSIS — H35033 Hypertensive retinopathy, bilateral: Secondary | ICD-10-CM | POA: Diagnosis not present

## 2016-01-09 DIAGNOSIS — H35372 Puckering of macula, left eye: Secondary | ICD-10-CM | POA: Diagnosis not present

## 2016-01-26 DIAGNOSIS — D485 Neoplasm of uncertain behavior of skin: Secondary | ICD-10-CM | POA: Diagnosis not present

## 2016-01-26 DIAGNOSIS — L821 Other seborrheic keratosis: Secondary | ICD-10-CM | POA: Diagnosis not present

## 2016-01-26 DIAGNOSIS — L702 Acne varioliformis: Secondary | ICD-10-CM | POA: Diagnosis not present

## 2016-01-26 DIAGNOSIS — L400 Psoriasis vulgaris: Secondary | ICD-10-CM | POA: Diagnosis not present

## 2016-01-26 DIAGNOSIS — D1801 Hemangioma of skin and subcutaneous tissue: Secondary | ICD-10-CM | POA: Diagnosis not present

## 2016-01-26 DIAGNOSIS — L7211 Pilar cyst: Secondary | ICD-10-CM | POA: Diagnosis not present

## 2016-01-26 DIAGNOSIS — L820 Inflamed seborrheic keratosis: Secondary | ICD-10-CM | POA: Diagnosis not present

## 2016-01-26 DIAGNOSIS — L728 Other follicular cysts of the skin and subcutaneous tissue: Secondary | ICD-10-CM | POA: Diagnosis not present

## 2016-01-30 DIAGNOSIS — C44311 Basal cell carcinoma of skin of nose: Secondary | ICD-10-CM | POA: Diagnosis not present

## 2016-02-05 DIAGNOSIS — M5416 Radiculopathy, lumbar region: Secondary | ICD-10-CM | POA: Diagnosis not present

## 2016-02-05 DIAGNOSIS — M1990 Unspecified osteoarthritis, unspecified site: Secondary | ICD-10-CM | POA: Diagnosis not present

## 2016-02-05 DIAGNOSIS — I10 Essential (primary) hypertension: Secondary | ICD-10-CM | POA: Diagnosis not present

## 2016-02-05 DIAGNOSIS — Z6837 Body mass index (BMI) 37.0-37.9, adult: Secondary | ICD-10-CM | POA: Diagnosis not present

## 2016-02-05 DIAGNOSIS — I872 Venous insufficiency (chronic) (peripheral): Secondary | ICD-10-CM | POA: Diagnosis not present

## 2016-02-05 DIAGNOSIS — E038 Other specified hypothyroidism: Secondary | ICD-10-CM | POA: Diagnosis not present

## 2016-02-05 DIAGNOSIS — E784 Other hyperlipidemia: Secondary | ICD-10-CM | POA: Diagnosis not present

## 2016-02-05 DIAGNOSIS — M792 Neuralgia and neuritis, unspecified: Secondary | ICD-10-CM | POA: Diagnosis not present

## 2016-02-05 DIAGNOSIS — R609 Edema, unspecified: Secondary | ICD-10-CM | POA: Diagnosis not present

## 2016-02-05 DIAGNOSIS — E1065 Type 1 diabetes mellitus with hyperglycemia: Secondary | ICD-10-CM | POA: Diagnosis not present

## 2016-02-05 DIAGNOSIS — I359 Nonrheumatic aortic valve disorder, unspecified: Secondary | ICD-10-CM | POA: Diagnosis not present

## 2016-02-05 DIAGNOSIS — E669 Obesity, unspecified: Secondary | ICD-10-CM | POA: Diagnosis not present

## 2016-02-06 DIAGNOSIS — Z124 Encounter for screening for malignant neoplasm of cervix: Secondary | ICD-10-CM | POA: Diagnosis not present

## 2016-02-06 DIAGNOSIS — Z1231 Encounter for screening mammogram for malignant neoplasm of breast: Secondary | ICD-10-CM | POA: Diagnosis not present

## 2016-02-06 DIAGNOSIS — Z6837 Body mass index (BMI) 37.0-37.9, adult: Secondary | ICD-10-CM | POA: Diagnosis not present

## 2016-02-12 ENCOUNTER — Other Ambulatory Visit: Payer: Self-pay | Admitting: Obstetrics and Gynecology

## 2016-02-12 DIAGNOSIS — R928 Other abnormal and inconclusive findings on diagnostic imaging of breast: Secondary | ICD-10-CM

## 2016-02-14 DIAGNOSIS — M19042 Primary osteoarthritis, left hand: Secondary | ICD-10-CM | POA: Diagnosis not present

## 2016-02-14 DIAGNOSIS — M65841 Other synovitis and tenosynovitis, right hand: Secondary | ICD-10-CM | POA: Diagnosis not present

## 2016-02-14 DIAGNOSIS — M19041 Primary osteoarthritis, right hand: Secondary | ICD-10-CM | POA: Diagnosis not present

## 2016-02-16 ENCOUNTER — Ambulatory Visit
Admission: RE | Admit: 2016-02-16 | Discharge: 2016-02-16 | Disposition: A | Discharge status: Home / Self Care | Payer: BLUE CROSS/BLUE SHIELD | Source: Ambulatory Visit | Attending: Obstetrics and Gynecology | Admitting: Obstetrics and Gynecology

## 2016-02-16 DIAGNOSIS — R928 Other abnormal and inconclusive findings on diagnostic imaging of breast: Secondary | ICD-10-CM

## 2016-02-16 DIAGNOSIS — N63 Unspecified lump in breast: Secondary | ICD-10-CM | POA: Diagnosis not present

## 2016-02-28 DIAGNOSIS — C44311 Basal cell carcinoma of skin of nose: Secondary | ICD-10-CM | POA: Diagnosis not present

## 2016-02-29 DIAGNOSIS — L905 Scar conditions and fibrosis of skin: Secondary | ICD-10-CM | POA: Diagnosis not present

## 2016-03-01 ENCOUNTER — Telehealth: Payer: Self-pay | Admitting: Cardiology

## 2016-03-01 NOTE — Telephone Encounter (Signed)
Returned call to patient.Dr.Jordan advised cardiologist in [place].

## 2016-03-01 NOTE — Telephone Encounter (Signed)
Pt have moved to [facility]. She would like for Dr Martinique to refer her to a Cardiologist there please.

## 2016-03-07 DIAGNOSIS — M65841 Other synovitis and tenosynovitis, right hand: Secondary | ICD-10-CM | POA: Diagnosis not present

## 2016-03-20 DIAGNOSIS — E1140 Type 2 diabetes mellitus with diabetic neuropathy, unspecified: Secondary | ICD-10-CM | POA: Diagnosis not present

## 2016-03-20 DIAGNOSIS — L84 Corns and callosities: Secondary | ICD-10-CM | POA: Diagnosis not present

## 2016-03-20 DIAGNOSIS — L603 Nail dystrophy: Secondary | ICD-10-CM | POA: Diagnosis not present

## 2016-04-25 DIAGNOSIS — I10 Essential (primary) hypertension: Secondary | ICD-10-CM | POA: Diagnosis not present

## 2016-04-25 DIAGNOSIS — M5416 Radiculopathy, lumbar region: Secondary | ICD-10-CM | POA: Diagnosis not present

## 2016-04-25 DIAGNOSIS — Z6839 Body mass index (BMI) 39.0-39.9, adult: Secondary | ICD-10-CM | POA: Diagnosis not present

## 2016-04-25 DIAGNOSIS — M4316 Spondylolisthesis, lumbar region: Secondary | ICD-10-CM | POA: Diagnosis not present

## 2016-05-17 DIAGNOSIS — K2270 Barrett's esophagus without dysplasia: Secondary | ICD-10-CM | POA: Diagnosis not present

## 2016-05-17 DIAGNOSIS — K5730 Diverticulosis of large intestine without perforation or abscess without bleeding: Secondary | ICD-10-CM | POA: Diagnosis not present

## 2016-05-17 DIAGNOSIS — Z1211 Encounter for screening for malignant neoplasm of colon: Secondary | ICD-10-CM | POA: Diagnosis not present

## 2016-05-17 DIAGNOSIS — Z800 Family history of malignant neoplasm of digestive organs: Secondary | ICD-10-CM | POA: Diagnosis not present

## 2016-05-28 DIAGNOSIS — K2270 Barrett's esophagus without dysplasia: Secondary | ICD-10-CM | POA: Diagnosis not present

## 2016-06-03 DIAGNOSIS — E1140 Type 2 diabetes mellitus with diabetic neuropathy, unspecified: Secondary | ICD-10-CM | POA: Diagnosis not present

## 2016-06-03 DIAGNOSIS — L84 Corns and callosities: Secondary | ICD-10-CM | POA: Diagnosis not present

## 2016-06-03 DIAGNOSIS — L603 Nail dystrophy: Secondary | ICD-10-CM | POA: Diagnosis not present

## 2016-06-05 DIAGNOSIS — E669 Obesity, unspecified: Secondary | ICD-10-CM | POA: Diagnosis not present

## 2016-06-05 DIAGNOSIS — R609 Edema, unspecified: Secondary | ICD-10-CM | POA: Diagnosis not present

## 2016-06-05 DIAGNOSIS — M792 Neuralgia and neuritis, unspecified: Secondary | ICD-10-CM | POA: Diagnosis not present

## 2016-06-05 DIAGNOSIS — Z23 Encounter for immunization: Secondary | ICD-10-CM | POA: Diagnosis not present

## 2016-06-05 DIAGNOSIS — E784 Other hyperlipidemia: Secondary | ICD-10-CM | POA: Diagnosis not present

## 2016-06-05 DIAGNOSIS — M1990 Unspecified osteoarthritis, unspecified site: Secondary | ICD-10-CM | POA: Diagnosis not present

## 2016-06-05 DIAGNOSIS — I872 Venous insufficiency (chronic) (peripheral): Secondary | ICD-10-CM | POA: Diagnosis not present

## 2016-06-05 DIAGNOSIS — Z6838 Body mass index (BMI) 38.0-38.9, adult: Secondary | ICD-10-CM | POA: Diagnosis not present

## 2016-06-05 DIAGNOSIS — I359 Nonrheumatic aortic valve disorder, unspecified: Secondary | ICD-10-CM | POA: Diagnosis not present

## 2016-06-05 DIAGNOSIS — I10 Essential (primary) hypertension: Secondary | ICD-10-CM | POA: Diagnosis not present

## 2016-06-05 DIAGNOSIS — E1065 Type 1 diabetes mellitus with hyperglycemia: Secondary | ICD-10-CM | POA: Diagnosis not present

## 2016-06-05 DIAGNOSIS — E038 Other specified hypothyroidism: Secondary | ICD-10-CM | POA: Diagnosis not present

## 2016-06-20 DIAGNOSIS — M25512 Pain in left shoulder: Secondary | ICD-10-CM | POA: Diagnosis not present

## 2016-07-05 DIAGNOSIS — M7582 Other shoulder lesions, left shoulder: Secondary | ICD-10-CM | POA: Diagnosis not present

## 2016-07-05 DIAGNOSIS — M75102 Unspecified rotator cuff tear or rupture of left shoulder, not specified as traumatic: Secondary | ICD-10-CM | POA: Diagnosis not present

## 2016-07-05 DIAGNOSIS — S46012A Strain of muscle(s) and tendon(s) of the rotator cuff of left shoulder, initial encounter: Secondary | ICD-10-CM | POA: Diagnosis not present

## 2016-07-05 DIAGNOSIS — M19012 Primary osteoarthritis, left shoulder: Secondary | ICD-10-CM | POA: Diagnosis not present

## 2016-07-10 DIAGNOSIS — M7542 Impingement syndrome of left shoulder: Secondary | ICD-10-CM | POA: Diagnosis not present

## 2016-07-10 DIAGNOSIS — M75112 Incomplete rotator cuff tear or rupture of left shoulder, not specified as traumatic: Secondary | ICD-10-CM | POA: Diagnosis not present

## 2016-07-10 DIAGNOSIS — M19012 Primary osteoarthritis, left shoulder: Secondary | ICD-10-CM | POA: Diagnosis not present

## 2016-07-11 ENCOUNTER — Telehealth: Payer: Self-pay | Admitting: Cardiology

## 2016-07-11 ENCOUNTER — Telehealth: Payer: Self-pay | Admitting: Pediatrics

## 2016-07-11 DIAGNOSIS — I359 Nonrheumatic aortic valve disorder, unspecified: Secondary | ICD-10-CM

## 2016-07-11 NOTE — Telephone Encounter (Signed)
OK with  Me

## 2016-07-11 NOTE — Telephone Encounter (Signed)
New message    Patient move to [facility] need in witting a referral to Dr. Terisa Starr   Fax # [phone]

## 2016-07-11 NOTE — Telephone Encounter (Signed)
I spoke with pt's husband, Marcello Moores (on [place]). He states they have moved to [facility] and the cardiology office that Dr. Angelena Form recommended for him requires a written referral.  He states his wife chooses to go there as well. I advised him I would do so.  I entered referral into [employer] for Dr. Julieanne Cotton.  I left the order pending-awaiting permission from Dr. Martinique.

## 2016-07-12 NOTE — Telephone Encounter (Signed)
Note faxed to Dr.Randall Goodroe at fax # [phone].

## 2016-07-12 NOTE — Telephone Encounter (Signed)
Note faxed to Dr.Randall Goodroe at fax # [phone].

## 2016-07-29 DIAGNOSIS — Z08 Encounter for follow-up examination after completed treatment for malignant neoplasm: Secondary | ICD-10-CM | POA: Diagnosis not present

## 2016-07-29 DIAGNOSIS — L918 Other hypertrophic disorders of the skin: Secondary | ICD-10-CM | POA: Diagnosis not present

## 2016-07-29 DIAGNOSIS — L821 Other seborrheic keratosis: Secondary | ICD-10-CM | POA: Diagnosis not present

## 2016-07-29 DIAGNOSIS — L400 Psoriasis vulgaris: Secondary | ICD-10-CM | POA: Diagnosis not present

## 2016-07-29 DIAGNOSIS — L720 Epidermal cyst: Secondary | ICD-10-CM | POA: Diagnosis not present

## 2016-07-29 DIAGNOSIS — D1801 Hemangioma of skin and subcutaneous tissue: Secondary | ICD-10-CM | POA: Diagnosis not present

## 2016-07-29 DIAGNOSIS — Z85828 Personal history of other malignant neoplasm of skin: Secondary | ICD-10-CM | POA: Diagnosis not present

## 2016-07-29 DIAGNOSIS — L7211 Pilar cyst: Secondary | ICD-10-CM | POA: Diagnosis not present

## 2016-07-30 ENCOUNTER — Telehealth: Payer: Self-pay | Admitting: Cardiology

## 2016-07-30 DIAGNOSIS — E113213 Type 2 diabetes mellitus with mild nonproliferative diabetic retinopathy with macular edema, bilateral: Secondary | ICD-10-CM | POA: Diagnosis not present

## 2016-07-30 DIAGNOSIS — H2513 Age-related nuclear cataract, bilateral: Secondary | ICD-10-CM | POA: Diagnosis not present

## 2016-07-30 DIAGNOSIS — H35372 Puckering of macula, left eye: Secondary | ICD-10-CM | POA: Diagnosis not present

## 2016-07-30 DIAGNOSIS — H35033 Hypertensive retinopathy, bilateral: Secondary | ICD-10-CM | POA: Diagnosis not present

## 2016-07-30 NOTE — Telephone Encounter (Signed)
Notified patient referral to [facility] in [place], [place] [address], phone [phone] fax [phone] has been faxed and they will contact patient to set up appointment

## 2016-08-20 DIAGNOSIS — E1140 Type 2 diabetes mellitus with diabetic neuropathy, unspecified: Secondary | ICD-10-CM | POA: Diagnosis not present

## 2016-08-20 DIAGNOSIS — L603 Nail dystrophy: Secondary | ICD-10-CM | POA: Diagnosis not present

## 2016-08-20 DIAGNOSIS — L84 Corns and callosities: Secondary | ICD-10-CM | POA: Diagnosis not present

## 2016-09-11 DIAGNOSIS — H2513 Age-related nuclear cataract, bilateral: Secondary | ICD-10-CM | POA: Diagnosis not present

## 2016-09-11 DIAGNOSIS — H25013 Cortical age-related cataract, bilateral: Secondary | ICD-10-CM | POA: Diagnosis not present

## 2016-09-24 DIAGNOSIS — E785 Hyperlipidemia, unspecified: Secondary | ICD-10-CM | POA: Diagnosis not present

## 2016-09-24 DIAGNOSIS — I10 Essential (primary) hypertension: Secondary | ICD-10-CM | POA: Diagnosis not present

## 2016-09-24 DIAGNOSIS — I350 Nonrheumatic aortic (valve) stenosis: Secondary | ICD-10-CM | POA: Diagnosis not present

## 2016-09-24 DIAGNOSIS — Z952 Presence of prosthetic heart valve: Secondary | ICD-10-CM | POA: Diagnosis not present

## 2016-10-04 DIAGNOSIS — E1140 Type 2 diabetes mellitus with diabetic neuropathy, unspecified: Secondary | ICD-10-CM | POA: Diagnosis not present

## 2016-10-04 DIAGNOSIS — B351 Tinea unguium: Secondary | ICD-10-CM | POA: Diagnosis not present

## 2016-10-04 DIAGNOSIS — L603 Nail dystrophy: Secondary | ICD-10-CM | POA: Diagnosis not present

## 2016-10-11 ENCOUNTER — Ambulatory Visit: Payer: BLUE CROSS/BLUE SHIELD | Admitting: Podiatry

## 2016-10-16 DIAGNOSIS — R609 Edema, unspecified: Secondary | ICD-10-CM | POA: Diagnosis not present

## 2016-10-16 DIAGNOSIS — I10 Essential (primary) hypertension: Secondary | ICD-10-CM | POA: Diagnosis not present

## 2016-10-16 DIAGNOSIS — E038 Other specified hypothyroidism: Secondary | ICD-10-CM | POA: Diagnosis not present

## 2016-10-16 DIAGNOSIS — Z1389 Encounter for screening for other disorder: Secondary | ICD-10-CM | POA: Diagnosis not present

## 2016-10-16 DIAGNOSIS — I359 Nonrheumatic aortic valve disorder, unspecified: Secondary | ICD-10-CM | POA: Diagnosis not present

## 2016-10-16 DIAGNOSIS — E1065 Type 1 diabetes mellitus with hyperglycemia: Secondary | ICD-10-CM | POA: Diagnosis not present

## 2016-10-16 DIAGNOSIS — M5416 Radiculopathy, lumbar region: Secondary | ICD-10-CM | POA: Diagnosis not present

## 2016-10-16 DIAGNOSIS — E669 Obesity, unspecified: Secondary | ICD-10-CM | POA: Diagnosis not present

## 2016-10-16 DIAGNOSIS — R252 Cramp and spasm: Secondary | ICD-10-CM | POA: Diagnosis not present

## 2016-10-16 DIAGNOSIS — E784 Other hyperlipidemia: Secondary | ICD-10-CM | POA: Diagnosis not present

## 2016-10-16 DIAGNOSIS — I872 Venous insufficiency (chronic) (peripheral): Secondary | ICD-10-CM | POA: Diagnosis not present

## 2016-10-16 DIAGNOSIS — M792 Neuralgia and neuritis, unspecified: Secondary | ICD-10-CM | POA: Diagnosis not present

## 2016-10-17 DIAGNOSIS — M4316 Spondylolisthesis, lumbar region: Secondary | ICD-10-CM | POA: Diagnosis not present

## 2016-10-18 DIAGNOSIS — H2511 Age-related nuclear cataract, right eye: Secondary | ICD-10-CM | POA: Diagnosis not present

## 2016-10-18 DIAGNOSIS — Z01818 Encounter for other preprocedural examination: Secondary | ICD-10-CM | POA: Diagnosis not present

## 2016-10-18 DIAGNOSIS — H2513 Age-related nuclear cataract, bilateral: Secondary | ICD-10-CM | POA: Diagnosis not present

## 2016-10-18 DIAGNOSIS — M4316 Spondylolisthesis, lumbar region: Secondary | ICD-10-CM | POA: Diagnosis not present

## 2016-10-21 DIAGNOSIS — M2042 Other hammer toe(s) (acquired), left foot: Secondary | ICD-10-CM | POA: Diagnosis not present

## 2016-10-21 DIAGNOSIS — M2011 Hallux valgus (acquired), right foot: Secondary | ICD-10-CM | POA: Diagnosis not present

## 2016-10-21 DIAGNOSIS — B351 Tinea unguium: Secondary | ICD-10-CM | POA: Diagnosis not present

## 2016-10-21 DIAGNOSIS — E1140 Type 2 diabetes mellitus with diabetic neuropathy, unspecified: Secondary | ICD-10-CM | POA: Diagnosis not present

## 2016-10-23 ENCOUNTER — Ambulatory Visit (HOSPITAL_COMMUNITY)
Admission: RE | Admit: 2016-10-23 | Discharge: 2016-10-23 | Disposition: A | Discharge status: Home / Self Care | Payer: Medicare Other | Source: Ambulatory Visit | Attending: Neurosurgery | Admitting: Neurosurgery

## 2016-10-23 ENCOUNTER — Other Ambulatory Visit (HOSPITAL_COMMUNITY): Payer: Self-pay | Admitting: Neurosurgery

## 2016-10-23 DIAGNOSIS — M4316 Spondylolisthesis, lumbar region: Secondary | ICD-10-CM

## 2016-10-23 DIAGNOSIS — M5126 Other intervertebral disc displacement, lumbar region: Secondary | ICD-10-CM | POA: Insufficient documentation

## 2016-10-23 DIAGNOSIS — M48061 Spinal stenosis, lumbar region without neurogenic claudication: Secondary | ICD-10-CM | POA: Insufficient documentation

## 2016-10-23 DIAGNOSIS — M47896 Other spondylosis, lumbar region: Secondary | ICD-10-CM | POA: Diagnosis not present

## 2016-10-23 MED ORDER — GADOBENATE DIMEGLUMINE 529 MG/ML IV SOLN
20.0000 mL | Freq: Once | INTRAVENOUS | Status: AC | PRN
  Administered 2016-10-23: 18 mL via INTRAVENOUS

## 2016-10-24 DIAGNOSIS — H2511 Age-related nuclear cataract, right eye: Secondary | ICD-10-CM | POA: Diagnosis not present

## 2016-10-24 DIAGNOSIS — H52202 Unspecified astigmatism, left eye: Secondary | ICD-10-CM | POA: Diagnosis not present

## 2016-10-24 DIAGNOSIS — H25812 Combined forms of age-related cataract, left eye: Secondary | ICD-10-CM | POA: Diagnosis not present

## 2016-10-31 DIAGNOSIS — I10 Essential (primary) hypertension: Secondary | ICD-10-CM | POA: Diagnosis not present

## 2016-10-31 DIAGNOSIS — Z6838 Body mass index (BMI) 38.0-38.9, adult: Secondary | ICD-10-CM | POA: Diagnosis not present

## 2016-10-31 DIAGNOSIS — M5416 Radiculopathy, lumbar region: Secondary | ICD-10-CM | POA: Diagnosis not present

## 2016-10-31 DIAGNOSIS — M4316 Spondylolisthesis, lumbar region: Secondary | ICD-10-CM | POA: Diagnosis not present

## 2016-11-07 DIAGNOSIS — H2513 Age-related nuclear cataract, bilateral: Secondary | ICD-10-CM | POA: Diagnosis not present

## 2016-11-07 DIAGNOSIS — H25811 Combined forms of age-related cataract, right eye: Secondary | ICD-10-CM | POA: Diagnosis not present

## 2016-11-27 DIAGNOSIS — I872 Venous insufficiency (chronic) (peripheral): Secondary | ICD-10-CM | POA: Diagnosis not present

## 2016-11-27 DIAGNOSIS — R609 Edema, unspecified: Secondary | ICD-10-CM | POA: Diagnosis not present

## 2016-11-27 DIAGNOSIS — I10 Essential (primary) hypertension: Secondary | ICD-10-CM | POA: Diagnosis not present

## 2016-11-27 DIAGNOSIS — M792 Neuralgia and neuritis, unspecified: Secondary | ICD-10-CM | POA: Diagnosis not present

## 2016-11-27 DIAGNOSIS — E669 Obesity, unspecified: Secondary | ICD-10-CM | POA: Diagnosis not present

## 2016-11-27 DIAGNOSIS — E1065 Type 1 diabetes mellitus with hyperglycemia: Secondary | ICD-10-CM | POA: Diagnosis not present

## 2016-11-27 DIAGNOSIS — M5416 Radiculopathy, lumbar region: Secondary | ICD-10-CM | POA: Diagnosis not present

## 2016-11-27 DIAGNOSIS — Z0000 Encounter for general adult medical examination without abnormal findings: Secondary | ICD-10-CM | POA: Diagnosis not present

## 2016-11-27 DIAGNOSIS — Z6838 Body mass index (BMI) 38.0-38.9, adult: Secondary | ICD-10-CM | POA: Diagnosis not present

## 2016-11-27 DIAGNOSIS — Z6379 Other stressful life events affecting family and household: Secondary | ICD-10-CM | POA: Diagnosis not present

## 2016-11-27 DIAGNOSIS — E784 Other hyperlipidemia: Secondary | ICD-10-CM | POA: Diagnosis not present

## 2016-11-27 DIAGNOSIS — E038 Other specified hypothyroidism: Secondary | ICD-10-CM | POA: Diagnosis not present

## 2016-12-31 DIAGNOSIS — L84 Corns and callosities: Secondary | ICD-10-CM | POA: Diagnosis not present

## 2016-12-31 DIAGNOSIS — E1140 Type 2 diabetes mellitus with diabetic neuropathy, unspecified: Secondary | ICD-10-CM | POA: Diagnosis not present

## 2016-12-31 DIAGNOSIS — M2011 Hallux valgus (acquired), right foot: Secondary | ICD-10-CM | POA: Diagnosis not present

## 2016-12-31 DIAGNOSIS — Z008 Encounter for other general examination: Secondary | ICD-10-CM | POA: Diagnosis not present

## 2016-12-31 DIAGNOSIS — B351 Tinea unguium: Secondary | ICD-10-CM | POA: Diagnosis not present

## 2017-01-07 DIAGNOSIS — M47816 Spondylosis without myelopathy or radiculopathy, lumbar region: Secondary | ICD-10-CM | POA: Diagnosis not present

## 2017-01-07 DIAGNOSIS — M47817 Spondylosis without myelopathy or radiculopathy, lumbosacral region: Secondary | ICD-10-CM | POA: Diagnosis not present

## 2017-01-07 DIAGNOSIS — Z79899 Other long term (current) drug therapy: Secondary | ICD-10-CM | POA: Diagnosis not present

## 2017-01-09 DIAGNOSIS — M47816 Spondylosis without myelopathy or radiculopathy, lumbar region: Secondary | ICD-10-CM | POA: Diagnosis not present

## 2017-01-09 DIAGNOSIS — M47817 Spondylosis without myelopathy or radiculopathy, lumbosacral region: Secondary | ICD-10-CM | POA: Diagnosis not present

## 2017-01-16 DIAGNOSIS — M47817 Spondylosis without myelopathy or radiculopathy, lumbosacral region: Secondary | ICD-10-CM | POA: Diagnosis not present

## 2017-01-16 DIAGNOSIS — M47816 Spondylosis without myelopathy or radiculopathy, lumbar region: Secondary | ICD-10-CM | POA: Diagnosis not present

## 2017-01-31 DIAGNOSIS — I8312 Varicose veins of left lower extremity with inflammation: Secondary | ICD-10-CM | POA: Diagnosis not present

## 2017-01-31 DIAGNOSIS — H61032 Chondritis of left external ear: Secondary | ICD-10-CM | POA: Diagnosis not present

## 2017-01-31 DIAGNOSIS — I8311 Varicose veins of right lower extremity with inflammation: Secondary | ICD-10-CM | POA: Diagnosis not present

## 2017-01-31 DIAGNOSIS — L400 Psoriasis vulgaris: Secondary | ICD-10-CM | POA: Diagnosis not present

## 2017-01-31 DIAGNOSIS — L821 Other seborrheic keratosis: Secondary | ICD-10-CM | POA: Diagnosis not present

## 2017-01-31 DIAGNOSIS — D225 Melanocytic nevi of trunk: Secondary | ICD-10-CM | POA: Diagnosis not present

## 2017-01-31 DIAGNOSIS — L820 Inflamed seborrheic keratosis: Secondary | ICD-10-CM | POA: Diagnosis not present

## 2017-01-31 DIAGNOSIS — R208 Other disturbances of skin sensation: Secondary | ICD-10-CM | POA: Diagnosis not present

## 2017-01-31 DIAGNOSIS — D485 Neoplasm of uncertain behavior of skin: Secondary | ICD-10-CM | POA: Diagnosis not present

## 2017-01-31 DIAGNOSIS — Z08 Encounter for follow-up examination after completed treatment for malignant neoplasm: Secondary | ICD-10-CM | POA: Diagnosis not present

## 2017-01-31 DIAGNOSIS — Z85828 Personal history of other malignant neoplasm of skin: Secondary | ICD-10-CM | POA: Diagnosis not present

## 2017-02-03 DIAGNOSIS — D2339 Other benign neoplasm of skin of other parts of face: Secondary | ICD-10-CM | POA: Diagnosis not present

## 2017-02-03 DIAGNOSIS — L309 Dermatitis, unspecified: Secondary | ICD-10-CM | POA: Diagnosis not present

## 2017-02-10 DIAGNOSIS — H6121 Impacted cerumen, right ear: Secondary | ICD-10-CM | POA: Diagnosis not present

## 2017-02-10 DIAGNOSIS — H6123 Impacted cerumen, bilateral: Secondary | ICD-10-CM | POA: Insufficient documentation

## 2017-02-11 DIAGNOSIS — Z6837 Body mass index (BMI) 37.0-37.9, adult: Secondary | ICD-10-CM | POA: Diagnosis not present

## 2017-02-11 DIAGNOSIS — Z1231 Encounter for screening mammogram for malignant neoplasm of breast: Secondary | ICD-10-CM | POA: Diagnosis not present

## 2017-02-11 DIAGNOSIS — N958 Other specified menopausal and perimenopausal disorders: Secondary | ICD-10-CM | POA: Diagnosis not present

## 2017-02-11 DIAGNOSIS — M8588 Other specified disorders of bone density and structure, other site: Secondary | ICD-10-CM | POA: Diagnosis not present

## 2017-02-11 DIAGNOSIS — Z01419 Encounter for gynecological examination (general) (routine) without abnormal findings: Secondary | ICD-10-CM | POA: Diagnosis not present

## 2017-02-12 DIAGNOSIS — R222 Localized swelling, mass and lump, trunk: Secondary | ICD-10-CM | POA: Diagnosis not present

## 2017-02-12 DIAGNOSIS — Z6837 Body mass index (BMI) 37.0-37.9, adult: Secondary | ICD-10-CM | POA: Diagnosis not present

## 2017-02-17 DIAGNOSIS — H903 Sensorineural hearing loss, bilateral: Secondary | ICD-10-CM | POA: Diagnosis not present

## 2017-02-24 DIAGNOSIS — M47817 Spondylosis without myelopathy or radiculopathy, lumbosacral region: Secondary | ICD-10-CM | POA: Diagnosis not present

## 2017-02-24 DIAGNOSIS — Z79899 Other long term (current) drug therapy: Secondary | ICD-10-CM | POA: Diagnosis not present

## 2017-02-24 DIAGNOSIS — M47816 Spondylosis without myelopathy or radiculopathy, lumbar region: Secondary | ICD-10-CM | POA: Diagnosis not present

## 2017-03-26 DIAGNOSIS — R609 Edema, unspecified: Secondary | ICD-10-CM | POA: Diagnosis not present

## 2017-03-26 DIAGNOSIS — I359 Nonrheumatic aortic valve disorder, unspecified: Secondary | ICD-10-CM | POA: Diagnosis not present

## 2017-03-26 DIAGNOSIS — Z6379 Other stressful life events affecting family and household: Secondary | ICD-10-CM | POA: Diagnosis not present

## 2017-03-26 DIAGNOSIS — I10 Essential (primary) hypertension: Secondary | ICD-10-CM | POA: Diagnosis not present

## 2017-03-26 DIAGNOSIS — R222 Localized swelling, mass and lump, trunk: Secondary | ICD-10-CM | POA: Diagnosis not present

## 2017-03-26 DIAGNOSIS — M5416 Radiculopathy, lumbar region: Secondary | ICD-10-CM | POA: Diagnosis not present

## 2017-03-26 DIAGNOSIS — Z6837 Body mass index (BMI) 37.0-37.9, adult: Secondary | ICD-10-CM | POA: Diagnosis not present

## 2017-03-26 DIAGNOSIS — E669 Obesity, unspecified: Secondary | ICD-10-CM | POA: Diagnosis not present

## 2017-03-26 DIAGNOSIS — E784 Other hyperlipidemia: Secondary | ICD-10-CM | POA: Diagnosis not present

## 2017-03-26 DIAGNOSIS — E038 Other specified hypothyroidism: Secondary | ICD-10-CM | POA: Diagnosis not present

## 2017-03-26 DIAGNOSIS — I872 Venous insufficiency (chronic) (peripheral): Secondary | ICD-10-CM | POA: Diagnosis not present

## 2017-03-26 DIAGNOSIS — E1065 Type 1 diabetes mellitus with hyperglycemia: Secondary | ICD-10-CM | POA: Diagnosis not present

## 2017-04-03 DIAGNOSIS — M47817 Spondylosis without myelopathy or radiculopathy, lumbosacral region: Secondary | ICD-10-CM | POA: Diagnosis not present

## 2017-04-03 DIAGNOSIS — M47816 Spondylosis without myelopathy or radiculopathy, lumbar region: Secondary | ICD-10-CM | POA: Diagnosis not present

## 2017-04-03 DIAGNOSIS — Z79899 Other long term (current) drug therapy: Secondary | ICD-10-CM | POA: Diagnosis not present

## 2017-04-08 DIAGNOSIS — M545 Low back pain: Secondary | ICD-10-CM | POA: Diagnosis not present

## 2017-04-08 DIAGNOSIS — S39012S Strain of muscle, fascia and tendon of lower back, sequela: Secondary | ICD-10-CM | POA: Diagnosis not present

## 2017-04-08 DIAGNOSIS — R2689 Other abnormalities of gait and mobility: Secondary | ICD-10-CM | POA: Diagnosis not present

## 2017-04-15 DIAGNOSIS — S39012S Strain of muscle, fascia and tendon of lower back, sequela: Secondary | ICD-10-CM | POA: Diagnosis not present

## 2017-04-15 DIAGNOSIS — M545 Low back pain: Secondary | ICD-10-CM | POA: Diagnosis not present

## 2017-04-15 DIAGNOSIS — Z981 Arthrodesis status: Secondary | ICD-10-CM | POA: Diagnosis not present

## 2017-04-15 DIAGNOSIS — R2689 Other abnormalities of gait and mobility: Secondary | ICD-10-CM | POA: Diagnosis not present

## 2017-04-15 DIAGNOSIS — M47816 Spondylosis without myelopathy or radiculopathy, lumbar region: Secondary | ICD-10-CM | POA: Diagnosis not present

## 2017-04-15 DIAGNOSIS — M549 Dorsalgia, unspecified: Secondary | ICD-10-CM | POA: Diagnosis not present

## 2017-04-15 DIAGNOSIS — M5136 Other intervertebral disc degeneration, lumbar region: Secondary | ICD-10-CM | POA: Diagnosis not present

## 2017-04-15 DIAGNOSIS — M5126 Other intervertebral disc displacement, lumbar region: Secondary | ICD-10-CM | POA: Diagnosis not present

## 2017-04-15 DIAGNOSIS — M4326 Fusion of spine, lumbar region: Secondary | ICD-10-CM | POA: Diagnosis not present

## 2017-04-17 DIAGNOSIS — M545 Low back pain: Secondary | ICD-10-CM | POA: Diagnosis not present

## 2017-04-17 DIAGNOSIS — S39012S Strain of muscle, fascia and tendon of lower back, sequela: Secondary | ICD-10-CM | POA: Diagnosis not present

## 2017-04-17 DIAGNOSIS — M47816 Spondylosis without myelopathy or radiculopathy, lumbar region: Secondary | ICD-10-CM | POA: Diagnosis not present

## 2017-04-17 DIAGNOSIS — R2689 Other abnormalities of gait and mobility: Secondary | ICD-10-CM | POA: Diagnosis not present

## 2017-04-24 DIAGNOSIS — S39012S Strain of muscle, fascia and tendon of lower back, sequela: Secondary | ICD-10-CM | POA: Diagnosis not present

## 2017-04-24 DIAGNOSIS — R2689 Other abnormalities of gait and mobility: Secondary | ICD-10-CM | POA: Diagnosis not present

## 2017-04-24 DIAGNOSIS — M545 Low back pain: Secondary | ICD-10-CM | POA: Diagnosis not present

## 2017-04-25 DIAGNOSIS — S39012S Strain of muscle, fascia and tendon of lower back, sequela: Secondary | ICD-10-CM | POA: Diagnosis not present

## 2017-04-25 DIAGNOSIS — M545 Low back pain: Secondary | ICD-10-CM | POA: Diagnosis not present

## 2017-04-25 DIAGNOSIS — R2689 Other abnormalities of gait and mobility: Secondary | ICD-10-CM | POA: Diagnosis not present

## 2017-04-28 DIAGNOSIS — M2011 Hallux valgus (acquired), right foot: Secondary | ICD-10-CM | POA: Diagnosis not present

## 2017-04-28 DIAGNOSIS — B351 Tinea unguium: Secondary | ICD-10-CM | POA: Diagnosis not present

## 2017-04-28 DIAGNOSIS — S39012S Strain of muscle, fascia and tendon of lower back, sequela: Secondary | ICD-10-CM | POA: Diagnosis not present

## 2017-04-28 DIAGNOSIS — E1140 Type 2 diabetes mellitus with diabetic neuropathy, unspecified: Secondary | ICD-10-CM | POA: Diagnosis not present

## 2017-04-28 DIAGNOSIS — M545 Low back pain: Secondary | ICD-10-CM | POA: Diagnosis not present

## 2017-04-28 DIAGNOSIS — R2689 Other abnormalities of gait and mobility: Secondary | ICD-10-CM | POA: Diagnosis not present

## 2017-04-28 DIAGNOSIS — L84 Corns and callosities: Secondary | ICD-10-CM | POA: Diagnosis not present

## 2017-04-30 DIAGNOSIS — M545 Low back pain: Secondary | ICD-10-CM | POA: Diagnosis not present

## 2017-04-30 DIAGNOSIS — S39012S Strain of muscle, fascia and tendon of lower back, sequela: Secondary | ICD-10-CM | POA: Diagnosis not present

## 2017-04-30 DIAGNOSIS — R2689 Other abnormalities of gait and mobility: Secondary | ICD-10-CM | POA: Diagnosis not present

## 2017-05-02 DIAGNOSIS — S39012S Strain of muscle, fascia and tendon of lower back, sequela: Secondary | ICD-10-CM | POA: Diagnosis not present

## 2017-05-02 DIAGNOSIS — M545 Low back pain: Secondary | ICD-10-CM | POA: Diagnosis not present

## 2017-05-02 DIAGNOSIS — R2689 Other abnormalities of gait and mobility: Secondary | ICD-10-CM | POA: Diagnosis not present

## 2017-06-16 DIAGNOSIS — H9313 Tinnitus, bilateral: Secondary | ICD-10-CM | POA: Diagnosis not present

## 2017-06-16 DIAGNOSIS — H61002 Unspecified perichondritis of left external ear: Secondary | ICD-10-CM | POA: Diagnosis not present

## 2017-06-16 DIAGNOSIS — H903 Sensorineural hearing loss, bilateral: Secondary | ICD-10-CM | POA: Diagnosis not present

## 2017-06-17 DIAGNOSIS — M47816 Spondylosis without myelopathy or radiculopathy, lumbar region: Secondary | ICD-10-CM | POA: Diagnosis not present

## 2017-06-23 DIAGNOSIS — M545 Low back pain: Secondary | ICD-10-CM | POA: Diagnosis not present

## 2017-06-23 DIAGNOSIS — R262 Difficulty in walking, not elsewhere classified: Secondary | ICD-10-CM | POA: Diagnosis not present

## 2017-06-23 DIAGNOSIS — M4805 Spinal stenosis, thoracolumbar region: Secondary | ICD-10-CM | POA: Diagnosis not present

## 2017-06-25 DIAGNOSIS — R262 Difficulty in walking, not elsewhere classified: Secondary | ICD-10-CM | POA: Diagnosis not present

## 2017-06-25 DIAGNOSIS — M4326 Fusion of spine, lumbar region: Secondary | ICD-10-CM | POA: Diagnosis not present

## 2017-06-25 DIAGNOSIS — M545 Low back pain: Secondary | ICD-10-CM | POA: Diagnosis not present

## 2017-06-25 DIAGNOSIS — M4805 Spinal stenosis, thoracolumbar region: Secondary | ICD-10-CM | POA: Diagnosis not present

## 2017-06-25 DIAGNOSIS — Q062 Diastematomyelia: Secondary | ICD-10-CM | POA: Diagnosis not present

## 2017-06-25 DIAGNOSIS — M47816 Spondylosis without myelopathy or radiculopathy, lumbar region: Secondary | ICD-10-CM | POA: Diagnosis not present

## 2017-06-26 DIAGNOSIS — R262 Difficulty in walking, not elsewhere classified: Secondary | ICD-10-CM | POA: Diagnosis not present

## 2017-06-26 DIAGNOSIS — M19042 Primary osteoarthritis, left hand: Secondary | ICD-10-CM | POA: Diagnosis not present

## 2017-06-26 DIAGNOSIS — M545 Low back pain: Secondary | ICD-10-CM | POA: Diagnosis not present

## 2017-06-26 DIAGNOSIS — G5601 Carpal tunnel syndrome, right upper limb: Secondary | ICD-10-CM | POA: Diagnosis not present

## 2017-06-26 DIAGNOSIS — M4805 Spinal stenosis, thoracolumbar region: Secondary | ICD-10-CM | POA: Diagnosis not present

## 2017-06-30 DIAGNOSIS — Z23 Encounter for immunization: Secondary | ICD-10-CM | POA: Diagnosis not present

## 2017-06-30 DIAGNOSIS — E668 Other obesity: Secondary | ICD-10-CM | POA: Diagnosis not present

## 2017-06-30 DIAGNOSIS — Z6379 Other stressful life events affecting family and household: Secondary | ICD-10-CM | POA: Diagnosis not present

## 2017-06-30 DIAGNOSIS — M5416 Radiculopathy, lumbar region: Secondary | ICD-10-CM | POA: Diagnosis not present

## 2017-06-30 DIAGNOSIS — E1065 Type 1 diabetes mellitus with hyperglycemia: Secondary | ICD-10-CM | POA: Diagnosis not present

## 2017-06-30 DIAGNOSIS — I10 Essential (primary) hypertension: Secondary | ICD-10-CM | POA: Diagnosis not present

## 2017-06-30 DIAGNOSIS — R6889 Other general symptoms and signs: Secondary | ICD-10-CM | POA: Diagnosis not present

## 2017-06-30 DIAGNOSIS — Z6837 Body mass index (BMI) 37.0-37.9, adult: Secondary | ICD-10-CM | POA: Diagnosis not present

## 2017-06-30 DIAGNOSIS — E7849 Other hyperlipidemia: Secondary | ICD-10-CM | POA: Diagnosis not present

## 2017-06-30 DIAGNOSIS — I358 Other nonrheumatic aortic valve disorders: Secondary | ICD-10-CM | POA: Diagnosis not present

## 2017-06-30 DIAGNOSIS — I872 Venous insufficiency (chronic) (peripheral): Secondary | ICD-10-CM | POA: Diagnosis not present

## 2017-06-30 DIAGNOSIS — R600 Localized edema: Secondary | ICD-10-CM | POA: Diagnosis not present

## 2017-07-02 DIAGNOSIS — M545 Low back pain: Secondary | ICD-10-CM | POA: Diagnosis not present

## 2017-07-02 DIAGNOSIS — M2011 Hallux valgus (acquired), right foot: Secondary | ICD-10-CM | POA: Diagnosis not present

## 2017-07-02 DIAGNOSIS — L84 Corns and callosities: Secondary | ICD-10-CM | POA: Diagnosis not present

## 2017-07-02 DIAGNOSIS — M4805 Spinal stenosis, thoracolumbar region: Secondary | ICD-10-CM | POA: Diagnosis not present

## 2017-07-02 DIAGNOSIS — B351 Tinea unguium: Secondary | ICD-10-CM | POA: Diagnosis not present

## 2017-07-02 DIAGNOSIS — E1140 Type 2 diabetes mellitus with diabetic neuropathy, unspecified: Secondary | ICD-10-CM | POA: Diagnosis not present

## 2017-07-02 DIAGNOSIS — R262 Difficulty in walking, not elsewhere classified: Secondary | ICD-10-CM | POA: Diagnosis not present

## 2017-07-03 DIAGNOSIS — M47816 Spondylosis without myelopathy or radiculopathy, lumbar region: Secondary | ICD-10-CM | POA: Diagnosis not present

## 2017-07-04 DIAGNOSIS — R262 Difficulty in walking, not elsewhere classified: Secondary | ICD-10-CM | POA: Diagnosis not present

## 2017-07-04 DIAGNOSIS — M545 Low back pain: Secondary | ICD-10-CM | POA: Diagnosis not present

## 2017-07-04 DIAGNOSIS — M4805 Spinal stenosis, thoracolumbar region: Secondary | ICD-10-CM | POA: Diagnosis not present

## 2017-07-07 DIAGNOSIS — M47817 Spondylosis without myelopathy or radiculopathy, lumbosacral region: Secondary | ICD-10-CM | POA: Diagnosis not present

## 2017-07-07 DIAGNOSIS — Z79899 Other long term (current) drug therapy: Secondary | ICD-10-CM | POA: Diagnosis not present

## 2017-07-07 DIAGNOSIS — M47816 Spondylosis without myelopathy or radiculopathy, lumbar region: Secondary | ICD-10-CM | POA: Diagnosis not present

## 2017-07-08 DIAGNOSIS — M4805 Spinal stenosis, thoracolumbar region: Secondary | ICD-10-CM | POA: Diagnosis not present

## 2017-07-08 DIAGNOSIS — R262 Difficulty in walking, not elsewhere classified: Secondary | ICD-10-CM | POA: Diagnosis not present

## 2017-07-08 DIAGNOSIS — M545 Low back pain: Secondary | ICD-10-CM | POA: Diagnosis not present

## 2017-07-09 DIAGNOSIS — R262 Difficulty in walking, not elsewhere classified: Secondary | ICD-10-CM | POA: Diagnosis not present

## 2017-07-09 DIAGNOSIS — M4805 Spinal stenosis, thoracolumbar region: Secondary | ICD-10-CM | POA: Diagnosis not present

## 2017-07-09 DIAGNOSIS — M48061 Spinal stenosis, lumbar region without neurogenic claudication: Secondary | ICD-10-CM | POA: Diagnosis not present

## 2017-07-09 DIAGNOSIS — M545 Low back pain: Secondary | ICD-10-CM | POA: Diagnosis not present

## 2017-07-11 DIAGNOSIS — H26491 Other secondary cataract, right eye: Secondary | ICD-10-CM | POA: Diagnosis not present

## 2017-07-14 DIAGNOSIS — M545 Low back pain: Secondary | ICD-10-CM | POA: Diagnosis not present

## 2017-07-14 DIAGNOSIS — M4805 Spinal stenosis, thoracolumbar region: Secondary | ICD-10-CM | POA: Diagnosis not present

## 2017-07-14 DIAGNOSIS — R262 Difficulty in walking, not elsewhere classified: Secondary | ICD-10-CM | POA: Diagnosis not present

## 2017-07-15 DIAGNOSIS — K2270 Barrett's esophagus without dysplasia: Secondary | ICD-10-CM | POA: Diagnosis not present

## 2017-07-15 DIAGNOSIS — R1310 Dysphagia, unspecified: Secondary | ICD-10-CM | POA: Diagnosis not present

## 2017-07-15 DIAGNOSIS — H35372 Puckering of macula, left eye: Secondary | ICD-10-CM | POA: Diagnosis not present

## 2017-07-15 DIAGNOSIS — H35033 Hypertensive retinopathy, bilateral: Secondary | ICD-10-CM | POA: Diagnosis not present

## 2017-07-15 DIAGNOSIS — K210 Gastro-esophageal reflux disease with esophagitis: Secondary | ICD-10-CM | POA: Diagnosis not present

## 2017-07-15 DIAGNOSIS — Z800 Family history of malignant neoplasm of digestive organs: Secondary | ICD-10-CM | POA: Diagnosis not present

## 2017-07-15 DIAGNOSIS — K2210 Ulcer of esophagus without bleeding: Secondary | ICD-10-CM | POA: Diagnosis not present

## 2017-07-15 DIAGNOSIS — E113213 Type 2 diabetes mellitus with mild nonproliferative diabetic retinopathy with macular edema, bilateral: Secondary | ICD-10-CM | POA: Diagnosis not present

## 2017-07-15 DIAGNOSIS — Z961 Presence of intraocular lens: Secondary | ICD-10-CM | POA: Diagnosis not present

## 2017-07-15 DIAGNOSIS — Z794 Long term (current) use of insulin: Secondary | ICD-10-CM | POA: Diagnosis not present

## 2017-07-23 DIAGNOSIS — Z85828 Personal history of other malignant neoplasm of skin: Secondary | ICD-10-CM | POA: Diagnosis not present

## 2017-07-23 DIAGNOSIS — L400 Psoriasis vulgaris: Secondary | ICD-10-CM | POA: Diagnosis not present

## 2017-07-23 DIAGNOSIS — L821 Other seborrheic keratosis: Secondary | ICD-10-CM | POA: Diagnosis not present

## 2017-07-23 DIAGNOSIS — D2361 Other benign neoplasm of skin of right upper limb, including shoulder: Secondary | ICD-10-CM | POA: Diagnosis not present

## 2017-07-23 DIAGNOSIS — L720 Epidermal cyst: Secondary | ICD-10-CM | POA: Diagnosis not present

## 2017-07-23 DIAGNOSIS — D239 Other benign neoplasm of skin, unspecified: Secondary | ICD-10-CM | POA: Diagnosis not present

## 2017-07-23 DIAGNOSIS — I872 Venous insufficiency (chronic) (peripheral): Secondary | ICD-10-CM | POA: Diagnosis not present

## 2017-07-23 DIAGNOSIS — H61032 Chondritis of left external ear: Secondary | ICD-10-CM | POA: Diagnosis not present

## 2017-07-23 DIAGNOSIS — Z08 Encounter for follow-up examination after completed treatment for malignant neoplasm: Secondary | ICD-10-CM | POA: Diagnosis not present

## 2017-07-23 DIAGNOSIS — L820 Inflamed seborrheic keratosis: Secondary | ICD-10-CM | POA: Diagnosis not present

## 2017-07-29 DIAGNOSIS — R262 Difficulty in walking, not elsewhere classified: Secondary | ICD-10-CM | POA: Diagnosis not present

## 2017-07-29 DIAGNOSIS — M4805 Spinal stenosis, thoracolumbar region: Secondary | ICD-10-CM | POA: Diagnosis not present

## 2017-07-29 DIAGNOSIS — M545 Low back pain: Secondary | ICD-10-CM | POA: Diagnosis not present

## 2017-07-31 DIAGNOSIS — M48061 Spinal stenosis, lumbar region without neurogenic claudication: Secondary | ICD-10-CM | POA: Diagnosis not present

## 2017-08-01 DIAGNOSIS — D2322 Other benign neoplasm of skin of left ear and external auricular canal: Secondary | ICD-10-CM | POA: Diagnosis not present

## 2017-08-01 DIAGNOSIS — H61002 Unspecified perichondritis of left external ear: Secondary | ICD-10-CM | POA: Diagnosis not present

## 2017-08-04 DIAGNOSIS — M4805 Spinal stenosis, thoracolumbar region: Secondary | ICD-10-CM | POA: Diagnosis not present

## 2017-08-04 DIAGNOSIS — M545 Low back pain: Secondary | ICD-10-CM | POA: Diagnosis not present

## 2017-08-04 DIAGNOSIS — R262 Difficulty in walking, not elsewhere classified: Secondary | ICD-10-CM | POA: Diagnosis not present

## 2017-08-11 DIAGNOSIS — M545 Low back pain: Secondary | ICD-10-CM | POA: Diagnosis not present

## 2017-08-11 DIAGNOSIS — R262 Difficulty in walking, not elsewhere classified: Secondary | ICD-10-CM | POA: Diagnosis not present

## 2017-08-11 DIAGNOSIS — M4805 Spinal stenosis, thoracolumbar region: Secondary | ICD-10-CM | POA: Diagnosis not present

## 2017-08-19 DIAGNOSIS — E785 Hyperlipidemia, unspecified: Secondary | ICD-10-CM | POA: Diagnosis not present

## 2017-08-19 DIAGNOSIS — Z952 Presence of prosthetic heart valve: Secondary | ICD-10-CM | POA: Diagnosis not present

## 2017-08-19 DIAGNOSIS — M47816 Spondylosis without myelopathy or radiculopathy, lumbar region: Secondary | ICD-10-CM | POA: Diagnosis not present

## 2017-08-19 DIAGNOSIS — I350 Nonrheumatic aortic (valve) stenosis: Secondary | ICD-10-CM | POA: Diagnosis not present

## 2017-08-19 DIAGNOSIS — I10 Essential (primary) hypertension: Secondary | ICD-10-CM | POA: Diagnosis not present

## 2017-09-09 DIAGNOSIS — M48061 Spinal stenosis, lumbar region without neurogenic claudication: Secondary | ICD-10-CM | POA: Diagnosis not present

## 2017-09-10 DIAGNOSIS — H26491 Other secondary cataract, right eye: Secondary | ICD-10-CM | POA: Diagnosis not present

## 2017-09-16 DIAGNOSIS — E1140 Type 2 diabetes mellitus with diabetic neuropathy, unspecified: Secondary | ICD-10-CM | POA: Diagnosis not present

## 2017-09-16 DIAGNOSIS — B351 Tinea unguium: Secondary | ICD-10-CM | POA: Diagnosis not present

## 2017-09-16 DIAGNOSIS — M2011 Hallux valgus (acquired), right foot: Secondary | ICD-10-CM | POA: Diagnosis not present

## 2017-09-16 DIAGNOSIS — L84 Corns and callosities: Secondary | ICD-10-CM | POA: Diagnosis not present

## 2017-09-29 DIAGNOSIS — M48061 Spinal stenosis, lumbar region without neurogenic claudication: Secondary | ICD-10-CM | POA: Diagnosis not present

## 2017-10-29 DIAGNOSIS — M48061 Spinal stenosis, lumbar region without neurogenic claudication: Secondary | ICD-10-CM | POA: Diagnosis not present

## 2017-11-17 DIAGNOSIS — I10 Essential (primary) hypertension: Secondary | ICD-10-CM | POA: Diagnosis not present

## 2017-11-17 DIAGNOSIS — E785 Hyperlipidemia, unspecified: Secondary | ICD-10-CM | POA: Diagnosis not present

## 2017-11-17 DIAGNOSIS — I350 Nonrheumatic aortic (valve) stenosis: Secondary | ICD-10-CM | POA: Diagnosis not present

## 2017-11-17 DIAGNOSIS — Z952 Presence of prosthetic heart valve: Secondary | ICD-10-CM | POA: Diagnosis not present

## 2017-12-03 DIAGNOSIS — Z0000 Encounter for general adult medical examination without abnormal findings: Secondary | ICD-10-CM | POA: Diagnosis not present

## 2017-12-03 DIAGNOSIS — M1990 Unspecified osteoarthritis, unspecified site: Secondary | ICD-10-CM | POA: Diagnosis not present

## 2017-12-03 DIAGNOSIS — E038 Other specified hypothyroidism: Secondary | ICD-10-CM | POA: Diagnosis not present

## 2017-12-03 DIAGNOSIS — Z1389 Encounter for screening for other disorder: Secondary | ICD-10-CM | POA: Diagnosis not present

## 2017-12-03 DIAGNOSIS — E7849 Other hyperlipidemia: Secondary | ICD-10-CM | POA: Diagnosis not present

## 2017-12-03 DIAGNOSIS — R82998 Other abnormal findings in urine: Secondary | ICD-10-CM | POA: Diagnosis not present

## 2017-12-03 DIAGNOSIS — E1065 Type 1 diabetes mellitus with hyperglycemia: Secondary | ICD-10-CM | POA: Diagnosis not present

## 2017-12-03 DIAGNOSIS — I10 Essential (primary) hypertension: Secondary | ICD-10-CM | POA: Diagnosis not present

## 2017-12-03 DIAGNOSIS — Z6836 Body mass index (BMI) 36.0-36.9, adult: Secondary | ICD-10-CM | POA: Diagnosis not present

## 2017-12-09 DIAGNOSIS — M47817 Spondylosis without myelopathy or radiculopathy, lumbosacral region: Secondary | ICD-10-CM | POA: Insufficient documentation

## 2017-12-09 DIAGNOSIS — Z79899 Other long term (current) drug therapy: Secondary | ICD-10-CM | POA: Diagnosis not present

## 2018-01-13 DIAGNOSIS — M2011 Hallux valgus (acquired), right foot: Secondary | ICD-10-CM | POA: Diagnosis not present

## 2018-01-13 DIAGNOSIS — L84 Corns and callosities: Secondary | ICD-10-CM | POA: Diagnosis not present

## 2018-01-13 DIAGNOSIS — E1140 Type 2 diabetes mellitus with diabetic neuropathy, unspecified: Secondary | ICD-10-CM | POA: Diagnosis not present

## 2018-01-13 DIAGNOSIS — B351 Tinea unguium: Secondary | ICD-10-CM | POA: Diagnosis not present

## 2018-01-21 DIAGNOSIS — M47817 Spondylosis without myelopathy or radiculopathy, lumbosacral region: Secondary | ICD-10-CM | POA: Diagnosis not present

## 2018-01-27 DIAGNOSIS — Z85828 Personal history of other malignant neoplasm of skin: Secondary | ICD-10-CM | POA: Diagnosis not present

## 2018-01-27 DIAGNOSIS — L821 Other seborrheic keratosis: Secondary | ICD-10-CM | POA: Diagnosis not present

## 2018-01-27 DIAGNOSIS — Z08 Encounter for follow-up examination after completed treatment for malignant neoplasm: Secondary | ICD-10-CM | POA: Diagnosis not present

## 2018-01-27 DIAGNOSIS — H35033 Hypertensive retinopathy, bilateral: Secondary | ICD-10-CM | POA: Diagnosis not present

## 2018-01-27 DIAGNOSIS — L720 Epidermal cyst: Secondary | ICD-10-CM | POA: Diagnosis not present

## 2018-01-27 DIAGNOSIS — D239 Other benign neoplasm of skin, unspecified: Secondary | ICD-10-CM | POA: Diagnosis not present

## 2018-01-27 DIAGNOSIS — E113213 Type 2 diabetes mellitus with mild nonproliferative diabetic retinopathy with macular edema, bilateral: Secondary | ICD-10-CM | POA: Diagnosis not present

## 2018-01-27 DIAGNOSIS — B078 Other viral warts: Secondary | ICD-10-CM | POA: Diagnosis not present

## 2018-01-27 DIAGNOSIS — H35372 Puckering of macula, left eye: Secondary | ICD-10-CM | POA: Diagnosis not present

## 2018-01-27 DIAGNOSIS — R209 Unspecified disturbances of skin sensation: Secondary | ICD-10-CM | POA: Diagnosis not present

## 2018-02-10 DIAGNOSIS — Q062 Diastematomyelia: Secondary | ICD-10-CM | POA: Diagnosis not present

## 2018-02-10 DIAGNOSIS — M47816 Spondylosis without myelopathy or radiculopathy, lumbar region: Secondary | ICD-10-CM | POA: Diagnosis not present

## 2018-02-23 DIAGNOSIS — L84 Corns and callosities: Secondary | ICD-10-CM | POA: Diagnosis not present

## 2018-02-23 DIAGNOSIS — M2011 Hallux valgus (acquired), right foot: Secondary | ICD-10-CM | POA: Diagnosis not present

## 2018-02-23 DIAGNOSIS — B351 Tinea unguium: Secondary | ICD-10-CM | POA: Diagnosis not present

## 2018-02-23 DIAGNOSIS — E1140 Type 2 diabetes mellitus with diabetic neuropathy, unspecified: Secondary | ICD-10-CM | POA: Diagnosis not present

## 2018-03-17 DIAGNOSIS — M2011 Hallux valgus (acquired), right foot: Secondary | ICD-10-CM | POA: Diagnosis not present

## 2018-03-17 DIAGNOSIS — B351 Tinea unguium: Secondary | ICD-10-CM | POA: Diagnosis not present

## 2018-03-17 DIAGNOSIS — L84 Corns and callosities: Secondary | ICD-10-CM | POA: Diagnosis not present

## 2018-03-17 DIAGNOSIS — E1140 Type 2 diabetes mellitus with diabetic neuropathy, unspecified: Secondary | ICD-10-CM | POA: Diagnosis not present

## 2018-03-19 DIAGNOSIS — R937 Abnormal findings on diagnostic imaging of other parts of musculoskeletal system: Secondary | ICD-10-CM | POA: Diagnosis not present

## 2018-03-19 DIAGNOSIS — M5126 Other intervertebral disc displacement, lumbar region: Secondary | ICD-10-CM | POA: Diagnosis not present

## 2018-03-19 DIAGNOSIS — M545 Low back pain: Secondary | ICD-10-CM | POA: Diagnosis not present

## 2018-03-20 DIAGNOSIS — M47817 Spondylosis without myelopathy or radiculopathy, lumbosacral region: Secondary | ICD-10-CM | POA: Diagnosis not present

## 2018-03-30 DIAGNOSIS — M5126 Other intervertebral disc displacement, lumbar region: Secondary | ICD-10-CM | POA: Diagnosis not present

## 2018-04-08 DIAGNOSIS — M19041 Primary osteoarthritis, right hand: Secondary | ICD-10-CM | POA: Diagnosis not present

## 2018-04-08 DIAGNOSIS — G5601 Carpal tunnel syndrome, right upper limb: Secondary | ICD-10-CM | POA: Diagnosis not present

## 2018-04-14 DIAGNOSIS — G894 Chronic pain syndrome: Secondary | ICD-10-CM | POA: Diagnosis not present

## 2018-04-14 DIAGNOSIS — M5117 Intervertebral disc disorders with radiculopathy, lumbosacral region: Secondary | ICD-10-CM | POA: Diagnosis not present

## 2018-04-14 DIAGNOSIS — M5417 Radiculopathy, lumbosacral region: Secondary | ICD-10-CM | POA: Diagnosis not present

## 2018-04-14 DIAGNOSIS — M5137 Other intervertebral disc degeneration, lumbosacral region: Secondary | ICD-10-CM | POA: Diagnosis not present

## 2018-04-14 DIAGNOSIS — M5416 Radiculopathy, lumbar region: Secondary | ICD-10-CM | POA: Diagnosis not present

## 2018-05-21 DIAGNOSIS — M47817 Spondylosis without myelopathy or radiculopathy, lumbosacral region: Secondary | ICD-10-CM | POA: Diagnosis not present

## 2018-05-26 DIAGNOSIS — M5416 Radiculopathy, lumbar region: Secondary | ICD-10-CM | POA: Diagnosis not present

## 2018-05-26 DIAGNOSIS — G894 Chronic pain syndrome: Secondary | ICD-10-CM | POA: Diagnosis not present

## 2018-06-02 DIAGNOSIS — Z23 Encounter for immunization: Secondary | ICD-10-CM | POA: Diagnosis not present

## 2018-06-03 DIAGNOSIS — E1151 Type 2 diabetes mellitus with diabetic peripheral angiopathy without gangrene: Secondary | ICD-10-CM | POA: Diagnosis not present

## 2018-06-03 DIAGNOSIS — L851 Acquired keratosis [keratoderma] palmaris et plantaris: Secondary | ICD-10-CM | POA: Diagnosis not present

## 2018-06-30 DIAGNOSIS — L538 Other specified erythematous conditions: Secondary | ICD-10-CM | POA: Diagnosis not present

## 2018-06-30 DIAGNOSIS — L298 Other pruritus: Secondary | ICD-10-CM | POA: Diagnosis not present

## 2018-06-30 DIAGNOSIS — L820 Inflamed seborrheic keratosis: Secondary | ICD-10-CM | POA: Diagnosis not present

## 2018-06-30 DIAGNOSIS — C44311 Basal cell carcinoma of skin of nose: Secondary | ICD-10-CM | POA: Diagnosis not present

## 2018-06-30 DIAGNOSIS — D485 Neoplasm of uncertain behavior of skin: Secondary | ICD-10-CM | POA: Diagnosis not present

## 2018-07-02 DIAGNOSIS — I872 Venous insufficiency (chronic) (peripheral): Secondary | ICD-10-CM | POA: Diagnosis not present

## 2018-07-02 DIAGNOSIS — I359 Nonrheumatic aortic valve disorder, unspecified: Secondary | ICD-10-CM | POA: Diagnosis not present

## 2018-07-02 DIAGNOSIS — R6889 Other general symptoms and signs: Secondary | ICD-10-CM | POA: Diagnosis not present

## 2018-07-02 DIAGNOSIS — E1065 Type 1 diabetes mellitus with hyperglycemia: Secondary | ICD-10-CM | POA: Diagnosis not present

## 2018-07-02 DIAGNOSIS — Z6379 Other stressful life events affecting family and household: Secondary | ICD-10-CM | POA: Diagnosis not present

## 2018-07-02 DIAGNOSIS — E669 Obesity, unspecified: Secondary | ICD-10-CM | POA: Diagnosis not present

## 2018-07-02 DIAGNOSIS — I10 Essential (primary) hypertension: Secondary | ICD-10-CM | POA: Diagnosis not present

## 2018-07-02 DIAGNOSIS — M1990 Unspecified osteoarthritis, unspecified site: Secondary | ICD-10-CM | POA: Diagnosis not present

## 2018-07-02 DIAGNOSIS — E7849 Other hyperlipidemia: Secondary | ICD-10-CM | POA: Diagnosis not present

## 2018-07-02 DIAGNOSIS — E038 Other specified hypothyroidism: Secondary | ICD-10-CM | POA: Diagnosis not present

## 2018-07-02 DIAGNOSIS — M5416 Radiculopathy, lumbar region: Secondary | ICD-10-CM | POA: Diagnosis not present

## 2018-07-02 DIAGNOSIS — R609 Edema, unspecified: Secondary | ICD-10-CM | POA: Diagnosis not present

## 2018-07-21 DIAGNOSIS — Z01812 Encounter for preprocedural laboratory examination: Secondary | ICD-10-CM | POA: Diagnosis not present

## 2018-07-21 DIAGNOSIS — R9431 Abnormal electrocardiogram [ECG] [EKG]: Secondary | ICD-10-CM | POA: Diagnosis not present

## 2018-07-21 DIAGNOSIS — M5416 Radiculopathy, lumbar region: Secondary | ICD-10-CM | POA: Diagnosis not present

## 2018-07-21 DIAGNOSIS — Z01810 Encounter for preprocedural cardiovascular examination: Secondary | ICD-10-CM | POA: Diagnosis not present

## 2018-07-21 DIAGNOSIS — G894 Chronic pain syndrome: Secondary | ICD-10-CM | POA: Diagnosis not present

## 2018-07-24 DIAGNOSIS — K2270 Barrett's esophagus without dysplasia: Secondary | ICD-10-CM | POA: Diagnosis not present

## 2018-07-24 DIAGNOSIS — Z800 Family history of malignant neoplasm of digestive organs: Secondary | ICD-10-CM | POA: Diagnosis not present

## 2018-07-24 DIAGNOSIS — K5901 Slow transit constipation: Secondary | ICD-10-CM | POA: Diagnosis not present

## 2018-07-28 DIAGNOSIS — E113213 Type 2 diabetes mellitus with mild nonproliferative diabetic retinopathy with macular edema, bilateral: Secondary | ICD-10-CM | POA: Diagnosis not present

## 2018-07-28 DIAGNOSIS — H35372 Puckering of macula, left eye: Secondary | ICD-10-CM | POA: Diagnosis not present

## 2018-07-28 DIAGNOSIS — H35033 Hypertensive retinopathy, bilateral: Secondary | ICD-10-CM | POA: Diagnosis not present

## 2018-07-28 DIAGNOSIS — Z961 Presence of intraocular lens: Secondary | ICD-10-CM | POA: Diagnosis not present

## 2018-07-29 DIAGNOSIS — L7211 Pilar cyst: Secondary | ICD-10-CM | POA: Diagnosis not present

## 2018-07-29 DIAGNOSIS — L821 Other seborrheic keratosis: Secondary | ICD-10-CM | POA: Diagnosis not present

## 2018-07-29 DIAGNOSIS — C44311 Basal cell carcinoma of skin of nose: Secondary | ICD-10-CM | POA: Diagnosis not present

## 2018-07-29 DIAGNOSIS — H61032 Chondritis of left external ear: Secondary | ICD-10-CM | POA: Diagnosis not present

## 2018-07-29 DIAGNOSIS — Z08 Encounter for follow-up examination after completed treatment for malignant neoplasm: Secondary | ICD-10-CM | POA: Diagnosis not present

## 2018-07-29 DIAGNOSIS — Z85828 Personal history of other malignant neoplasm of skin: Secondary | ICD-10-CM | POA: Diagnosis not present

## 2018-07-29 DIAGNOSIS — L853 Xerosis cutis: Secondary | ICD-10-CM | POA: Diagnosis not present

## 2018-07-29 DIAGNOSIS — D2361 Other benign neoplasm of skin of right upper limb, including shoulder: Secondary | ICD-10-CM | POA: Diagnosis not present

## 2018-08-03 DIAGNOSIS — G894 Chronic pain syndrome: Secondary | ICD-10-CM | POA: Diagnosis not present

## 2018-08-19 DIAGNOSIS — M47817 Spondylosis without myelopathy or radiculopathy, lumbosacral region: Secondary | ICD-10-CM | POA: Diagnosis not present

## 2018-08-19 DIAGNOSIS — Z79899 Other long term (current) drug therapy: Secondary | ICD-10-CM | POA: Diagnosis not present

## 2018-09-07 DIAGNOSIS — Z1231 Encounter for screening mammogram for malignant neoplasm of breast: Secondary | ICD-10-CM | POA: Diagnosis not present

## 2018-09-07 DIAGNOSIS — Z6835 Body mass index (BMI) 35.0-35.9, adult: Secondary | ICD-10-CM | POA: Diagnosis not present

## 2018-09-07 DIAGNOSIS — Z01419 Encounter for gynecological examination (general) (routine) without abnormal findings: Secondary | ICD-10-CM | POA: Diagnosis not present

## 2018-09-14 DIAGNOSIS — M5416 Radiculopathy, lumbar region: Secondary | ICD-10-CM | POA: Diagnosis not present

## 2018-09-14 DIAGNOSIS — G894 Chronic pain syndrome: Secondary | ICD-10-CM | POA: Diagnosis not present

## 2018-09-15 DIAGNOSIS — E1151 Type 2 diabetes mellitus with diabetic peripheral angiopathy without gangrene: Secondary | ICD-10-CM | POA: Diagnosis not present

## 2018-09-15 DIAGNOSIS — L851 Acquired keratosis [keratoderma] palmaris et plantaris: Secondary | ICD-10-CM | POA: Diagnosis not present

## 2018-10-08 DIAGNOSIS — R2231 Localized swelling, mass and lump, right upper limb: Secondary | ICD-10-CM | POA: Diagnosis not present

## 2018-10-19 DIAGNOSIS — E038 Other specified hypothyroidism: Secondary | ICD-10-CM | POA: Diagnosis not present

## 2018-10-19 DIAGNOSIS — R6889 Other general symptoms and signs: Secondary | ICD-10-CM | POA: Diagnosis not present

## 2018-10-19 DIAGNOSIS — Z6379 Other stressful life events affecting family and household: Secondary | ICD-10-CM | POA: Diagnosis not present

## 2018-10-19 DIAGNOSIS — M5416 Radiculopathy, lumbar region: Secondary | ICD-10-CM | POA: Diagnosis not present

## 2018-10-19 DIAGNOSIS — K2270 Barrett's esophagus without dysplasia: Secondary | ICD-10-CM | POA: Diagnosis not present

## 2018-10-19 DIAGNOSIS — M1990 Unspecified osteoarthritis, unspecified site: Secondary | ICD-10-CM | POA: Diagnosis not present

## 2018-10-19 DIAGNOSIS — I10 Essential (primary) hypertension: Secondary | ICD-10-CM | POA: Diagnosis not present

## 2018-10-19 DIAGNOSIS — E1065 Type 1 diabetes mellitus with hyperglycemia: Secondary | ICD-10-CM | POA: Diagnosis not present

## 2018-10-19 DIAGNOSIS — I872 Venous insufficiency (chronic) (peripheral): Secondary | ICD-10-CM | POA: Diagnosis not present

## 2018-10-19 DIAGNOSIS — K644 Residual hemorrhoidal skin tags: Secondary | ICD-10-CM | POA: Diagnosis not present

## 2018-10-19 DIAGNOSIS — R609 Edema, unspecified: Secondary | ICD-10-CM | POA: Diagnosis not present

## 2018-10-19 DIAGNOSIS — E669 Obesity, unspecified: Secondary | ICD-10-CM | POA: Diagnosis not present

## 2018-10-19 DIAGNOSIS — I359 Nonrheumatic aortic valve disorder, unspecified: Secondary | ICD-10-CM | POA: Diagnosis not present

## 2018-10-19 DIAGNOSIS — Z800 Family history of malignant neoplasm of digestive organs: Secondary | ICD-10-CM | POA: Diagnosis not present

## 2018-10-19 DIAGNOSIS — E7849 Other hyperlipidemia: Secondary | ICD-10-CM | POA: Diagnosis not present

## 2018-10-19 DIAGNOSIS — K5901 Slow transit constipation: Secondary | ICD-10-CM | POA: Diagnosis not present

## 2018-10-21 DIAGNOSIS — M5416 Radiculopathy, lumbar region: Secondary | ICD-10-CM | POA: Diagnosis not present

## 2018-10-21 DIAGNOSIS — G894 Chronic pain syndrome: Secondary | ICD-10-CM | POA: Diagnosis not present

## 2018-10-27 DIAGNOSIS — C44311 Basal cell carcinoma of skin of nose: Secondary | ICD-10-CM | POA: Diagnosis not present

## 2018-11-11 DIAGNOSIS — Z952 Presence of prosthetic heart valve: Secondary | ICD-10-CM | POA: Diagnosis not present

## 2018-11-12 DIAGNOSIS — G5601 Carpal tunnel syndrome, right upper limb: Secondary | ICD-10-CM | POA: Diagnosis not present

## 2018-11-17 DIAGNOSIS — I10 Essential (primary) hypertension: Secondary | ICD-10-CM | POA: Diagnosis not present

## 2018-11-17 DIAGNOSIS — Z952 Presence of prosthetic heart valve: Secondary | ICD-10-CM | POA: Diagnosis not present

## 2018-11-17 DIAGNOSIS — E785 Hyperlipidemia, unspecified: Secondary | ICD-10-CM | POA: Diagnosis not present

## 2018-11-17 DIAGNOSIS — I350 Nonrheumatic aortic (valve) stenosis: Secondary | ICD-10-CM | POA: Diagnosis not present

## 2018-12-24 DIAGNOSIS — G894 Chronic pain syndrome: Secondary | ICD-10-CM | POA: Diagnosis not present

## 2018-12-24 DIAGNOSIS — M5416 Radiculopathy, lumbar region: Secondary | ICD-10-CM | POA: Diagnosis not present

## 2019-01-20 DIAGNOSIS — I10 Essential (primary) hypertension: Secondary | ICD-10-CM | POA: Diagnosis not present

## 2019-01-20 DIAGNOSIS — G4700 Insomnia, unspecified: Secondary | ICD-10-CM | POA: Diagnosis not present

## 2019-01-20 DIAGNOSIS — M5416 Radiculopathy, lumbar region: Secondary | ICD-10-CM | POA: Diagnosis not present

## 2019-01-20 DIAGNOSIS — M79606 Pain in leg, unspecified: Secondary | ICD-10-CM | POA: Diagnosis not present

## 2019-02-09 DIAGNOSIS — R233 Spontaneous ecchymoses: Secondary | ICD-10-CM | POA: Diagnosis not present

## 2019-02-09 DIAGNOSIS — L400 Psoriasis vulgaris: Secondary | ICD-10-CM | POA: Diagnosis not present

## 2019-02-15 DIAGNOSIS — E038 Other specified hypothyroidism: Secondary | ICD-10-CM | POA: Diagnosis not present

## 2019-02-15 DIAGNOSIS — E7849 Other hyperlipidemia: Secondary | ICD-10-CM | POA: Diagnosis not present

## 2019-02-15 DIAGNOSIS — R82998 Other abnormal findings in urine: Secondary | ICD-10-CM | POA: Diagnosis not present

## 2019-02-15 DIAGNOSIS — I10 Essential (primary) hypertension: Secondary | ICD-10-CM | POA: Diagnosis not present

## 2019-02-15 DIAGNOSIS — E1065 Type 1 diabetes mellitus with hyperglycemia: Secondary | ICD-10-CM | POA: Diagnosis not present

## 2019-02-16 DIAGNOSIS — M5416 Radiculopathy, lumbar region: Secondary | ICD-10-CM | POA: Diagnosis not present

## 2019-02-16 DIAGNOSIS — G894 Chronic pain syndrome: Secondary | ICD-10-CM | POA: Diagnosis not present

## 2019-02-22 DIAGNOSIS — I872 Venous insufficiency (chronic) (peripheral): Secondary | ICD-10-CM | POA: Diagnosis not present

## 2019-02-22 DIAGNOSIS — Z1339 Encounter for screening examination for other mental health and behavioral disorders: Secondary | ICD-10-CM | POA: Diagnosis not present

## 2019-02-22 DIAGNOSIS — Z1331 Encounter for screening for depression: Secondary | ICD-10-CM | POA: Diagnosis not present

## 2019-02-22 DIAGNOSIS — I10 Essential (primary) hypertension: Secondary | ICD-10-CM | POA: Diagnosis not present

## 2019-02-22 DIAGNOSIS — E669 Obesity, unspecified: Secondary | ICD-10-CM | POA: Diagnosis not present

## 2019-02-22 DIAGNOSIS — I359 Nonrheumatic aortic valve disorder, unspecified: Secondary | ICD-10-CM | POA: Diagnosis not present

## 2019-02-22 DIAGNOSIS — E119 Type 2 diabetes mellitus without complications: Secondary | ICD-10-CM | POA: Diagnosis not present

## 2019-02-22 DIAGNOSIS — E038 Other specified hypothyroidism: Secondary | ICD-10-CM | POA: Diagnosis not present

## 2019-02-22 DIAGNOSIS — Z0000 Encounter for general adult medical examination without abnormal findings: Secondary | ICD-10-CM | POA: Diagnosis not present

## 2019-02-24 DIAGNOSIS — Z794 Long term (current) use of insulin: Secondary | ICD-10-CM | POA: Diagnosis not present

## 2019-02-24 DIAGNOSIS — E119 Type 2 diabetes mellitus without complications: Secondary | ICD-10-CM | POA: Diagnosis not present

## 2019-02-24 DIAGNOSIS — I10 Essential (primary) hypertension: Secondary | ICD-10-CM | POA: Diagnosis not present

## 2019-02-25 DIAGNOSIS — M5416 Radiculopathy, lumbar region: Secondary | ICD-10-CM | POA: Diagnosis not present

## 2019-02-25 DIAGNOSIS — G894 Chronic pain syndrome: Secondary | ICD-10-CM | POA: Diagnosis not present

## 2019-02-26 DIAGNOSIS — E1151 Type 2 diabetes mellitus with diabetic peripheral angiopathy without gangrene: Secondary | ICD-10-CM | POA: Diagnosis not present

## 2019-02-26 DIAGNOSIS — L851 Acquired keratosis [keratoderma] palmaris et plantaris: Secondary | ICD-10-CM | POA: Diagnosis not present

## 2019-03-02 DIAGNOSIS — H35372 Puckering of macula, left eye: Secondary | ICD-10-CM | POA: Diagnosis not present

## 2019-03-02 DIAGNOSIS — H35033 Hypertensive retinopathy, bilateral: Secondary | ICD-10-CM | POA: Diagnosis not present

## 2019-03-02 DIAGNOSIS — E113213 Type 2 diabetes mellitus with mild nonproliferative diabetic retinopathy with macular edema, bilateral: Secondary | ICD-10-CM | POA: Diagnosis not present

## 2019-03-02 DIAGNOSIS — Z961 Presence of intraocular lens: Secondary | ICD-10-CM | POA: Diagnosis not present

## 2019-03-09 DIAGNOSIS — D239 Other benign neoplasm of skin, unspecified: Secondary | ICD-10-CM | POA: Diagnosis not present

## 2019-03-09 DIAGNOSIS — L821 Other seborrheic keratosis: Secondary | ICD-10-CM | POA: Diagnosis not present

## 2019-03-09 DIAGNOSIS — R233 Spontaneous ecchymoses: Secondary | ICD-10-CM | POA: Diagnosis not present

## 2019-03-09 DIAGNOSIS — Z85828 Personal history of other malignant neoplasm of skin: Secondary | ICD-10-CM | POA: Diagnosis not present

## 2019-03-09 DIAGNOSIS — L400 Psoriasis vulgaris: Secondary | ICD-10-CM | POA: Diagnosis not present

## 2019-03-09 DIAGNOSIS — Z08 Encounter for follow-up examination after completed treatment for malignant neoplasm: Secondary | ICD-10-CM | POA: Diagnosis not present

## 2019-03-09 DIAGNOSIS — H61032 Chondritis of left external ear: Secondary | ICD-10-CM | POA: Diagnosis not present

## 2019-03-09 DIAGNOSIS — L728 Other follicular cysts of the skin and subcutaneous tissue: Secondary | ICD-10-CM | POA: Diagnosis not present

## 2019-03-10 DIAGNOSIS — I350 Nonrheumatic aortic (valve) stenosis: Secondary | ICD-10-CM | POA: Diagnosis not present

## 2019-03-11 DIAGNOSIS — M5416 Radiculopathy, lumbar region: Secondary | ICD-10-CM | POA: Diagnosis not present

## 2019-03-11 DIAGNOSIS — G894 Chronic pain syndrome: Secondary | ICD-10-CM | POA: Diagnosis not present

## 2019-03-17 DIAGNOSIS — G894 Chronic pain syndrome: Secondary | ICD-10-CM | POA: Diagnosis not present

## 2019-03-17 DIAGNOSIS — M5416 Radiculopathy, lumbar region: Secondary | ICD-10-CM | POA: Diagnosis not present

## 2019-03-17 DIAGNOSIS — T85840A Pain due to nervous system prosthetic devices, implants and grafts, initial encounter: Secondary | ICD-10-CM | POA: Diagnosis not present

## 2019-03-17 DIAGNOSIS — T85123A Displacement of implanted electronic neurostimulator, generator, initial encounter: Secondary | ICD-10-CM | POA: Diagnosis not present

## 2019-03-19 DIAGNOSIS — H6123 Impacted cerumen, bilateral: Secondary | ICD-10-CM | POA: Diagnosis not present

## 2019-03-19 DIAGNOSIS — H9313 Tinnitus, bilateral: Secondary | ICD-10-CM | POA: Diagnosis not present

## 2019-03-19 DIAGNOSIS — H900 Conductive hearing loss, bilateral: Secondary | ICD-10-CM | POA: Diagnosis not present

## 2019-04-27 DIAGNOSIS — Z794 Long term (current) use of insulin: Secondary | ICD-10-CM | POA: Diagnosis not present

## 2019-04-27 DIAGNOSIS — I10 Essential (primary) hypertension: Secondary | ICD-10-CM | POA: Diagnosis not present

## 2019-04-27 DIAGNOSIS — E119 Type 2 diabetes mellitus without complications: Secondary | ICD-10-CM | POA: Diagnosis not present

## 2019-05-03 DIAGNOSIS — M5416 Radiculopathy, lumbar region: Secondary | ICD-10-CM | POA: Diagnosis not present

## 2019-05-03 DIAGNOSIS — G894 Chronic pain syndrome: Secondary | ICD-10-CM | POA: Diagnosis not present

## 2019-05-07 DIAGNOSIS — Z23 Encounter for immunization: Secondary | ICD-10-CM | POA: Diagnosis not present

## 2019-05-25 DIAGNOSIS — E1140 Type 2 diabetes mellitus with diabetic neuropathy, unspecified: Secondary | ICD-10-CM | POA: Diagnosis not present

## 2019-05-25 DIAGNOSIS — L84 Corns and callosities: Secondary | ICD-10-CM | POA: Diagnosis not present

## 2019-07-07 DIAGNOSIS — E669 Obesity, unspecified: Secondary | ICD-10-CM | POA: Diagnosis not present

## 2019-07-07 DIAGNOSIS — E1065 Type 1 diabetes mellitus with hyperglycemia: Secondary | ICD-10-CM | POA: Diagnosis not present

## 2019-07-07 DIAGNOSIS — I10 Essential (primary) hypertension: Secondary | ICD-10-CM | POA: Diagnosis not present

## 2019-07-07 DIAGNOSIS — E785 Hyperlipidemia, unspecified: Secondary | ICD-10-CM | POA: Diagnosis not present

## 2019-07-27 DIAGNOSIS — L84 Corns and callosities: Secondary | ICD-10-CM | POA: Diagnosis not present

## 2019-07-27 DIAGNOSIS — E1140 Type 2 diabetes mellitus with diabetic neuropathy, unspecified: Secondary | ICD-10-CM | POA: Diagnosis not present

## 2019-09-07 DIAGNOSIS — E113213 Type 2 diabetes mellitus with mild nonproliferative diabetic retinopathy with macular edema, bilateral: Secondary | ICD-10-CM | POA: Diagnosis not present

## 2019-09-07 DIAGNOSIS — Z8619 Personal history of other infectious and parasitic diseases: Secondary | ICD-10-CM | POA: Diagnosis not present

## 2019-09-07 DIAGNOSIS — E11311 Type 2 diabetes mellitus with unspecified diabetic retinopathy with macular edema: Secondary | ICD-10-CM | POA: Diagnosis not present

## 2019-09-07 DIAGNOSIS — Z794 Long term (current) use of insulin: Secondary | ICD-10-CM | POA: Diagnosis not present

## 2019-09-17 DIAGNOSIS — Z1159 Encounter for screening for other viral diseases: Secondary | ICD-10-CM | POA: Diagnosis not present

## 2019-09-21 ENCOUNTER — Telehealth: Payer: Self-pay | Admitting: Pulmonary Disease

## 2019-09-21 ENCOUNTER — Other Ambulatory Visit: Payer: Self-pay | Admitting: Pulmonary Disease

## 2019-09-21 DIAGNOSIS — I10 Essential (primary) hypertension: Secondary | ICD-10-CM | POA: Diagnosis not present

## 2019-09-21 DIAGNOSIS — B349 Viral infection, unspecified: Secondary | ICD-10-CM | POA: Diagnosis not present

## 2019-09-21 DIAGNOSIS — R05 Cough: Secondary | ICD-10-CM | POA: Diagnosis not present

## 2019-09-21 DIAGNOSIS — Z20818 Contact with and (suspected) exposure to other bacterial communicable diseases: Secondary | ICD-10-CM | POA: Diagnosis not present

## 2019-09-21 DIAGNOSIS — U071 COVID-19: Secondary | ICD-10-CM

## 2019-09-21 DIAGNOSIS — Z794 Long term (current) use of insulin: Secondary | ICD-10-CM | POA: Diagnosis not present

## 2019-09-21 DIAGNOSIS — E1065 Type 1 diabetes mellitus with hyperglycemia: Secondary | ICD-10-CM | POA: Diagnosis not present

## 2019-09-21 DIAGNOSIS — Z20822 Contact with and (suspected) exposure to covid-19: Secondary | ICD-10-CM | POA: Diagnosis not present

## 2019-09-21 DIAGNOSIS — E1169 Type 2 diabetes mellitus with other specified complication: Secondary | ICD-10-CM

## 2019-09-21 NOTE — Telephone Encounter (Signed)
09/18/2019  I connected by phone with Amy Russo on 09/21/2019 at 4:49 PM to discuss the potential use of an new treatment for mild to moderate COVID-19 viral infection in non-hospitalized patients.  This patient is a 69 y.o. female that meets the FDA criteria for Emergency Use Authorization of bamlanivimab or casirivimab\imdevimab.  Has a (+) direct SARS-CoV-2 viral test result  Has mild or moderate COVID-19   Is ? 69 years of age and weighs ? 40 kg  Is NOT hospitalized due to COVID-19  Is NOT requiring oxygen therapy or requiring an increase in baseline oxygen flow rate due to COVID-19  Is within 10 days of symptom onset  Has at least one of the high risk factor(s) for progression to severe COVID-19 and/or hospitalization as defined in EUA.  Specific high risk criteria : >/= 71 yo  Patient seen by primary care provider today.  Patient is currently symptomatic.  Rapid test was negative.  PCR test has been sent off today.  Symptoms started on 09/18/2019.  Patient is continuing to have symptoms of cough, headache, chills, nausea, fatigue.  Patient denies fevers.  Patient's husband and son are both currently positive.  I have spoken and communicated the following to the patient or parent/caregiver:  1. FDA has authorized the emergency use of bamlanivimab and casirivimab\imdevimab for the treatment of mild to moderate COVID-19 in adults and pediatric patients with positive results of direct SARS-CoV-2 viral testing who are 62 years of age and older weighing at least 40 kg, and who are at high risk for progressing to severe COVID-19 and/or hospitalization.  2. The significant known and potential risks and benefits of bamlanivimab and casirivimab\imdevimab, and the extent to which such potential risks and benefits are unknown.  3. Information on available alternative treatments and the risks and benefits of those alternatives, including clinical trials.  4. Patients treated with  bamlanivimab and casirivimab\imdevimab should continue to self-isolate and use infection control measures (e.g., wear mask, isolate, social distance, avoid sharing personal items, clean and disinfect "high touch" surfaces, and frequent handwashing) according to CDC guidelines.   5. The patient or parent/caregiver has the option to accept or refuse bamlanivimab or casirivimab\imdevimab .  After reviewing this information with the patient, The patient agreed to proceed with receiving the bamlanimivab infusion and will be provided a copy of the Fact sheet prior to receiving the infusion..  Patient has been scheduled for 09/24/2019 at 12:30 PM  Lauraine Rinne 09/21/2019 4:49 PM

## 2019-09-21 NOTE — Progress Notes (Signed)
09/18/2019  I connected by phone with Amy Russo on 09/21/2019 at 4:49 PM to discuss the potential use of an new treatment for mild to moderate COVID-19 viral infection in non-hospitalized patients.  This patient is a 69 y.o. female that meets the FDA criteria for Emergency Use Authorization of bamlanivimab or casirivimab\imdevimab.  Has a (+) direct SARS-CoV-2 viral test result  Has mild or moderate COVID-19   Is ?69 years of age and weighs ?40 kg  Is NOT hospitalized due to COVID-19  Is NOT requiring oxygen therapy or requiring an increase in baseline oxygen flow rate due to COVID-19  Is within 10 days of symptom onset  Has at least one of the high risk factor(s)for progression to severe COVID-19 and/or hospitalization as defined in EUA. ? Specific high risk criteria : >/= 71 yo  Patient seen by primary care provider today.  Patient is currently symptomatic.  Rapid test was negative.  PCR test has been sent off today.  Symptoms started on 09/18/2019.  Patient is continuing to have symptoms of cough, headache, chills, nausea, fatigue.  Patient denies fevers.  Patient's husband and son are both currently positive.  I have spoken and communicated the following to the patient or parent/caregiver:  1. FDA has authorized the emergency use of bamlanivimab and casirivimab\imdevimab for the treatment of mild to moderate COVID-19 in adults and pediatric patients with positive results of direct SARS-CoV-2 viral testing who are 70 years of age and older weighing at least 40 kg, and who are at high risk for progressing to severe COVID-19 and/or hospitalization.  2. The significant known and potential risks and benefits of bamlanivimab and casirivimab\imdevimab, and the extent to which such potential risks and benefits are unknown.  3. Information on available alternative treatments and the risks and benefits of those alternatives, including clinical trials.  4. Patients treated  with bamlanivimab and casirivimab\imdevimab should continue to self-isolate and use infection control measures (e.g., wear mask, isolate, social distance, avoid sharing personal items, clean and disinfect "high touch" surfaces, and frequent handwashing) according to CDC guidelines.   5. The patient or parent/caregiver has the option to accept or refuse bamlanivimab or casirivimab\imdevimab .  After reviewing this information with the patient, The patient agreed to proceed with receiving the bamlanimivab infusion and will be provided a copy of the Fact sheet prior to receiving the infusion..  Patient has been scheduled for 09/24/2019 at 12:30 PM

## 2019-09-24 ENCOUNTER — Ambulatory Visit (HOSPITAL_COMMUNITY)
Admission: RE | Admit: 2019-09-24 | Discharge: 2019-09-24 | Disposition: A | Discharge status: Home / Self Care | Payer: Medicare Other | Source: Ambulatory Visit | Attending: Internal Medicine | Admitting: Internal Medicine

## 2019-09-24 DIAGNOSIS — Z23 Encounter for immunization: Secondary | ICD-10-CM | POA: Insufficient documentation

## 2019-09-24 DIAGNOSIS — U071 COVID-19: Secondary | ICD-10-CM | POA: Diagnosis not present

## 2019-09-24 MED ORDER — ALBUTEROL SULFATE HFA 108 (90 BASE) MCG/ACT IN AERS: 2.0000 | INHALATION_SPRAY | Freq: Once | RESPIRATORY_TRACT | Status: DC | PRN

## 2019-09-24 MED ORDER — EPINEPHRINE 0.3 MG/0.3ML IJ SOAJ: 0.3000 mg | Freq: Once | INTRAMUSCULAR | Status: DC | PRN

## 2019-09-24 MED ORDER — DIPHENHYDRAMINE HCL 50 MG/ML IJ SOLN: 50.0000 mg | Freq: Once | INTRAMUSCULAR | Status: DC | PRN

## 2019-09-24 MED ORDER — SODIUM CHLORIDE 0.9 % IV SOLN
INTRAVENOUS | Status: DC | PRN
  Administered 2019-09-24: 12:00:00 250 mL via INTRAVENOUS

## 2019-09-24 MED ORDER — METHYLPREDNISOLONE SODIUM SUCC 125 MG IJ SOLR: 125.0000 mg | Freq: Once | INTRAMUSCULAR | Status: DC | PRN

## 2019-09-24 MED ORDER — FAMOTIDINE IN NACL 20-0.9 MG/50ML-% IV SOLN: 20.0000 mg | Freq: Once | INTRAVENOUS | Status: DC | PRN

## 2019-09-24 MED ORDER — SODIUM CHLORIDE 0.9 % IV SOLN
700.0000 mg | Freq: Once | INTRAVENOUS | Status: AC
  Administered 2019-09-24: 13:00:00 700 mg via INTRAVENOUS
  Filled 2019-09-24: qty 20

## 2019-09-24 NOTE — Discharge Instructions (Signed)

## 2019-09-24 NOTE — Progress Notes (Signed)
  Diagnosis: COVID-19  Physician: Dr. Joya Gaskins  Procedure: [facility] Med: bamlanivimab infusion - Provided patient with bamlanimivab fact sheet for patients, parents and caregivers prior to infusion.  Complications: No immediate complications noted.  Discharge: Discharged home   Tia Masker 09/24/2019

## 2019-09-27 ENCOUNTER — Other Ambulatory Visit: Payer: Self-pay

## 2019-09-27 ENCOUNTER — Encounter (HOSPITAL_COMMUNITY): Payer: Self-pay

## 2019-09-27 ENCOUNTER — Emergency Department (HOSPITAL_COMMUNITY): Payer: Medicare Other

## 2019-09-27 ENCOUNTER — Emergency Department (HOSPITAL_COMMUNITY)
Admission: EM | Admit: 2019-09-27 | Discharge: 2019-09-27 | Disposition: A | Discharge status: Home / Self Care | Payer: Medicare Other | Attending: Emergency Medicine | Admitting: Emergency Medicine

## 2019-09-27 DIAGNOSIS — Z7984 Long term (current) use of oral hypoglycemic drugs: Secondary | ICD-10-CM | POA: Insufficient documentation

## 2019-09-27 DIAGNOSIS — E039 Hypothyroidism, unspecified: Secondary | ICD-10-CM | POA: Insufficient documentation

## 2019-09-27 DIAGNOSIS — E119 Type 2 diabetes mellitus without complications: Secondary | ICD-10-CM | POA: Diagnosis not present

## 2019-09-27 DIAGNOSIS — I10 Essential (primary) hypertension: Secondary | ICD-10-CM | POA: Insufficient documentation

## 2019-09-27 DIAGNOSIS — Z7982 Long term (current) use of aspirin: Secondary | ICD-10-CM | POA: Diagnosis not present

## 2019-09-27 DIAGNOSIS — R000 Tachycardia, unspecified: Secondary | ICD-10-CM

## 2019-09-27 DIAGNOSIS — R7989 Other specified abnormal findings of blood chemistry: Secondary | ICD-10-CM | POA: Diagnosis not present

## 2019-09-27 DIAGNOSIS — Z79899 Other long term (current) drug therapy: Secondary | ICD-10-CM | POA: Insufficient documentation

## 2019-09-27 DIAGNOSIS — R0602 Shortness of breath: Secondary | ICD-10-CM | POA: Diagnosis not present

## 2019-09-27 DIAGNOSIS — R079 Chest pain, unspecified: Secondary | ICD-10-CM | POA: Diagnosis not present

## 2019-09-27 DIAGNOSIS — Z87891 Personal history of nicotine dependence: Secondary | ICD-10-CM | POA: Insufficient documentation

## 2019-09-27 DIAGNOSIS — U071 COVID-19: Secondary | ICD-10-CM | POA: Diagnosis not present

## 2019-09-27 LAB — CBC
HCT: 40.1 % (ref 36.0–46.0)
Hemoglobin: 13.5 g/dL (ref 12.0–15.0)
MCH: 31 pg (ref 26.0–34.0)
MCHC: 33.7 g/dL (ref 30.0–36.0)
MCV: 92 fL (ref 80.0–100.0)
Platelets: 275 10*3/uL (ref 150–400)
RBC: 4.36 MIL/uL (ref 3.87–5.11)
RDW: 14.3 % (ref 11.5–15.5)
WBC: 10.2 10*3/uL (ref 4.0–10.5)
nRBC: 0 % (ref 0.0–0.2)

## 2019-09-27 LAB — TROPONIN I (HIGH SENSITIVITY)
Troponin I (High Sensitivity): 21 ng/L — ABNORMAL HIGH (ref <18)
Troponin I (High Sensitivity): 16 ng/L (ref <18)

## 2019-09-27 LAB — BASIC METABOLIC PANEL
Anion gap: 12 (ref 5–15)
BUN: 19 mg/dL (ref 8–23)
CO2: 24 mmol/L (ref 22–32)
Calcium: 9 mg/dL (ref 8.9–10.3)
Chloride: 98 mmol/L (ref 98–111)
Creatinine, Ser: 0.83 mg/dL (ref 0.44–1.00)
GFR calc Af Amer: >60 mL/min (ref >60)
GFR calc non Af Amer: >60 mL/min (ref >60)
Glucose, Bld: 117 mg/dL — ABNORMAL HIGH (ref 70–99)
Potassium: 3.7 mmol/L (ref 3.5–5.1)
Sodium: 134 mmol/L — ABNORMAL LOW (ref 135–145)

## 2019-09-27 LAB — D-DIMER, QUANTITATIVE: D-Dimer, Quant: 1.94 ug/mL-FEU — ABNORMAL HIGH (ref 0.00–0.50)

## 2019-09-27 MED ORDER — SODIUM CHLORIDE (PF) 0.9 % IJ SOLN
INTRAMUSCULAR | Status: AC
  Filled 2019-09-27: qty 50

## 2019-09-27 MED ORDER — IBUPROFEN 200 MG PO TABS
600.0000 mg | ORAL_TABLET | Freq: Once | ORAL | Status: AC
  Administered 2019-09-27: 600 mg via ORAL
  Filled 2019-09-27: qty 3

## 2019-09-27 MED ORDER — SODIUM CHLORIDE 0.9% FLUSH: 3.0000 mL | Freq: Once | INTRAVENOUS | Status: DC

## 2019-09-27 MED ORDER — IOHEXOL 350 MG/ML SOLN
100.0000 mL | Freq: Once | INTRAVENOUS | Status: AC | PRN
  Administered 2019-09-27: 80 mL via INTRAVENOUS

## 2019-09-27 NOTE — ED Provider Notes (Signed)
[facility] DEPT Provider Note   CSN: [id number] Arrival date & time: 09/27/19  1521     History Chief Complaint  Patient presents with  . Tachycardia    Amy Russo is a 69 y.o. female.  Presents to ER with concern for elevated heart rate.  Patient diagnosed with Covid 1 week ago.  States that she had cough, chills, no fever.  Felt like her symptoms were improving and then developed episodes of palpitations.  Reports that her heart rate was elevated at home to 110s.  No ongoing chest pain at this time.  HPI     Past Medical History:  Diagnosis Date  . 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 Arthritis   . Barrett's esophagus   . Carpal tunnel syndrome    right  . Cataract    immature and not sure which eye  . Chronic back pain    spondylolisthesis/DDD/stenosis  . Diabetes mellitus    takes Novolog and Lantus daily  . Diverticulosis   . Esophageal stricture   . GERD (gastroesophageal reflux disease)    takes Omeprazole daily  . History of blood transfusion    no abnormal reaction noted  . History of colon polyps    benign  . History of shingles   . HTN (hypertension)    takes Lisinopril daily  . Hyperlipidemia    takes Atorvastatin daily  . Hypothyroidism    takes Synthroid daily  . Insomnia    Takes Restoril daily  . Joint pain   . Joint swelling   . Psoriasis   . Weakness    numbness and tingling    Patient Active Problem List   Diagnosis Date Noted  . Spondylolisthesis of lumbar region 11/17/2015  . S/P AVR (aortic valve replacement) 09/27/2011  . Aortic valve disease   . Diabetes mellitus   . HTN (hypertension)   . Obesity     Past Surgical History:  Procedure Laterality Date  . AVR  12/2006   #[facility] pericardial valve  . AXILLARY ARTERY ANEURYSM REPAIR Left 1958  . CARPAL TUNNEL RELEASE Left   . COLONOSCOPY    . cyst removed from left ear    . ESOPHAGOGASTRODUODENOSCOPY    . [place]  . TONSILLECTOMY  1955  . TRIGGER FINGER RELEASE Left   . TUBAL LIGATION       OB History   No obstetric history on file.     No family history on file.  Social History   Tobacco Use  . Smoking status: Former Research scientist (life sciences)  . Tobacco comment: quit smoking 30+ yrs ago  Substance Use Topics  . Alcohol use: Yes    Alcohol/week: 0.0 standard drinks    Comment: ocassionally  . Drug use: No    Home Medications Prior to Admission medications   Medication Sig Start Date End Date Taking? Authorizing Provider  aspirin 81 MG tablet Take 81 mg by mouth daily.    Yes [provider]  atorvastatin (LIPITOR) 20 MG tablet Take 1 tablet by mouth daily. 10/20/14  Yes [provider]  Calcium Carbonate-Vitamin D (CALCIUM + D PO) Take 600-800 mg by mouth daily.   Yes [provider]  Cyanocobalamin (VITAMIN B-12 PO) Take 1,000 mcg by mouth daily.    Yes [provider]  hydrochlorothiazide (HYDRODIURIL) 25 MG tablet Take 25 mg by mouth daily. 09/26/19  Yes [provider]  insulin aspart (  NOVOLOG) 100 UNIT/ML injection Inject 2-6 Units into the skin 3 (three) times daily as needed for high blood sugar.   Yes [provider]  LEVEMIR 100 UNIT/ML injection Inject 30 Units into the skin at bedtime.  09/08/19  Yes [provider]  levothyroxine (SYNTHROID, LEVOTHROID) 75 MCG tablet Take 75 mcg by mouth daily before breakfast.   Yes [provider]  lisinopril (PRINIVIL,ZESTRIL) 20 MG tablet Take 1 tablet (20 mg total) by mouth daily. 01/09/11 09/27/19 Yes Martinique, Peter M, MD  Multiple Vitamin (MULTI-VITAMIN PO) Take 1 tablet by mouth daily.    Yes [provider]  omeprazole (PRILOSEC) 20 MG capsule Take 20 mg by mouth 2 (two) times daily before a meal.   Yes [provider]  Pyridoxine HCl (VITAMIN B-6 PO) Take 200 mg by mouth daily.    Yes [provider]  temazepam (RESTORIL) 15 MG capsule Take 1-2 capsules  by mouth at bedtime as needed.  11/14/14  Yes [provider]  Turmeric 500 MG CAPS Take 2 capsules by mouth daily.   Yes [provider]  oxyCODONE-acetaminophen (PERCOCET/ROXICET) 5-325 MG tablet Take 1-2 tablets by mouth every 6 (six) hours as needed for moderate pain. Patient not taking: Reported on 09/27/2019 11/20/15   Ashok Pall, MD  tiZANidine (ZANAFLEX) 4 MG tablet Take 1 tablet (4 mg total) by mouth every 6 (six) hours as needed for muscle spasms. Patient not taking: Reported on 09/27/2019 11/20/15   Ashok Pall, MD    Allergies    Epinephrine  Review of Systems   Review of Systems  Constitutional: Positive for chills, fatigue and fever.  HENT: Negative for ear pain and sore throat.   Eyes: Negative for pain and visual disturbance.  Respiratory: Positive for cough. Negative for shortness of breath.   Cardiovascular: Positive for palpitations. Negative for chest pain.  Gastrointestinal: Negative for abdominal pain and vomiting.  Genitourinary: Negative for dysuria and hematuria.  Musculoskeletal: Negative for arthralgias and back pain.  Skin: Negative for color change and rash.  Neurological: Negative for seizures and syncope.  All other systems reviewed and are negative.   Physical Exam Updated Vital Signs BP (!) 163/57 (BP Location: Right Arm)   Pulse (!) 104   Temp 98.1 F (36.7 C) (Oral)   Resp 14   SpO2 100%   Physical Exam Vitals and nursing note reviewed.  Constitutional:      General: She is not in acute distress.    Appearance: She is well-developed.  HENT:     Head: Normocephalic and atraumatic.  Eyes:     Conjunctiva/sclera: Conjunctivae normal.  Cardiovascular:     Rate and Rhythm: Normal rate and regular rhythm.     Heart sounds: No murmur.  Pulmonary:     Effort: Pulmonary effort is normal. No respiratory distress.     Breath sounds: Normal breath sounds.  Abdominal:     Palpations: Abdomen is soft.     Tenderness: There is  no abdominal tenderness.  Musculoskeletal:        General: No swelling or tenderness.     Cervical back: Neck supple.  Skin:    General: Skin is warm and dry.     Capillary Refill: Capillary refill takes less than 2 seconds.  Neurological:     General: No focal deficit present.     Mental Status: She is alert and oriented to person, place, and time.  Psychiatric:        Mood and  Affect: Mood normal.        Behavior: Behavior normal.     ED Results / Procedures / Treatments   Labs (all labs ordered are listed, but only abnormal results are displayed) Labs Reviewed  BASIC METABOLIC PANEL - Abnormal; Notable for the following components:      Result Value   Sodium 134 (*)    Glucose, Bld 117 (*)    All other components within normal limits  D-DIMER, QUANTITATIVE (NOT AT [facility]) - Abnormal; Notable for the following components:   D-Dimer, Quant 1.94 (*)    All other components within normal limits  TROPONIN I (HIGH SENSITIVITY) - Abnormal; Notable for the following components:   Troponin I (High Sensitivity) 21 (*)    All other components within normal limits  CBC  TROPONIN I (HIGH SENSITIVITY)    EKG EKG Interpretation  Date/Time:  Monday September 27 2019 15:40:13 EST Ventricular Rate:  128 PR Interval:  148 QRS Duration: 76 QT Interval:  296 QTC Calculation: 432 R Axis:   2 Text Interpretation: Sinus tachycardia Anterior infarct , age undetermined Abnormal ECG Confirmed by Madalyn Rob [phone]) on 09/27/2019 5:11:03 PM   Radiology DG Chest 2 View  Result Date: 09/27/2019 CLINICAL DATA:  Chest pain. COVID-19. EXAM: CHEST - 2 VIEW COMPARISON:  01/22/2007 FINDINGS: The heart size and pulmonary vascularity are normal and the lungs are clear. Previous aortic valve replacement. Thoracic spinal cord stimulator in place. No bone abnormality. IMPRESSION: No acute disease. Electronically Signed   By: Lorriane Shire M.D.   On: 09/27/2019 16:20   CT Angio Chest PE W and/or Wo  Contrast  Result Date: 09/27/2019 CLINICAL DATA:  Shortness of breath and elevated D-dimer. EXAM: CT ANGIOGRAPHY CHEST WITH CONTRAST TECHNIQUE: Multidetector CT imaging of the chest was performed using the standard protocol during bolus administration of intravenous contrast. Multiplanar CT image reconstructions and MIPs were obtained to evaluate the vascular anatomy. CONTRAST:  48mL OMNIPAQUE IOHEXOL 350 MG/ML SOLN COMPARISON:  Jan 22, 2007 FINDINGS: Cardiovascular: Satisfactory opacification of the pulmonary arteries to the segmental level. No evidence of pulmonary embolism. Normal heart size. An artificial aortic valve is seen. No pericardial effusion. Marked severity coronary artery calcification is seen. Mediastinum/Nodes: No enlarged mediastinal, hilar, or axillary lymph nodes. Lungs/Pleura: Mild linear atelectasis is seen within the left lung base. There is no evidence of acute infiltrate, pleural effusion or pneumothorax. Upper Abdomen: No acute abnormality. Musculoskeletal: Multiple sternal wires are seen. Degenerative changes seen throughout the thoracic spine. A spinal stimulator wire is seen along the lower thoracic spine. Review of the MIP images confirms the above findings. IMPRESSION: 1. No CT evidence of pulmonary embolism or acute cardiopulmonary disease. 2. Evidence of prior median sternotomy. Electronically Signed   By: Virgina Norfolk M.D.   On: 09/27/2019 22:02    Procedures Procedures (including critical care time)  Medications Ordered in ED Medications  sodium chloride flush (NS) 0.9 % injection 3 mL (has no administration in time range)  sodium chloride (PF) 0.9 % injection (has no administration in time range)  ibuprofen (ADVIL) tablet 600 mg (600 mg Oral Given 09/27/19 1754)  iohexol (OMNIPAQUE) 350 MG/ML injection 100 mL (80 mLs Intravenous Contrast Given 09/27/19 2140)    ED Course  I have reviewed the triage vital signs and the nursing notes.  Pertinent labs & imaging  results that were available during my care of the patient were reviewed by me and considered in my medical decision making (see chart for  details).  Clinical Course as of Sep 28 47  Mon Sep 27, 2019  [id number] Complete initial assessment, will check dimer and reassess   [RD]  2048 Will check CTA  D-Dimer, Quant(!): 1.94 [RD]    Clinical Course User Index [RD] Lucrezia Starch, MD   MDM Rules/Calculators/A&P                      69-year-old lady presents to ER with complaint of palpitations, elevated heart rate in setting of COVID-19 infection.  EKG without ischemic changes, high-sensitivity troponin within normal limits, no delta troponin.  Doubt ACS.  Dimer was elevated, CTA however was negative for acute PE.  Suspect her symptoms are related to COVID-19.  Given a forementioned work-up, believe she is appropriate for discharge and outpatient management at this time.  Recommend recheck with primary doctor.    After the discussed management above, the patient was determined to be safe for discharge.  The patient was in agreement with this plan and all questions regarding their care were answered.  ED return precautions were discussed and the patient will return to the ED with any significant worsening of condition.   Final Clinical Impression(s) / ED Diagnoses Final diagnoses:  COVID-19  Tachycardia    Rx / DC Orders ED Discharge Orders    None       Lucrezia Starch, MD 09/28/19 [phone]

## 2019-09-27 NOTE — Discharge Instructions (Addendum)
Recommend scheduling follow-up appointment for virtual recheck with your primary doctor.  Return to ER if you develop any difficulty in breathing, chest pain or other new concerning symptom.  Recommend Tylenol, Motrin as needed for pain and fever control.

## 2019-09-27 NOTE — ED Triage Notes (Signed)
Pt was dx with COVID 1 week ago. Pt states that her heart rate is 114 and was sent here.  Pt dx at [facility].

## 2019-09-29 ENCOUNTER — Encounter (HOSPITAL_COMMUNITY): Payer: Self-pay

## 2019-10-18 DIAGNOSIS — L84 Corns and callosities: Secondary | ICD-10-CM | POA: Diagnosis not present

## 2019-10-18 DIAGNOSIS — G629 Polyneuropathy, unspecified: Secondary | ICD-10-CM | POA: Diagnosis not present

## 2019-10-18 DIAGNOSIS — E1140 Type 2 diabetes mellitus with diabetic neuropathy, unspecified: Secondary | ICD-10-CM | POA: Diagnosis not present

## 2019-11-16 DIAGNOSIS — L720 Epidermal cyst: Secondary | ICD-10-CM | POA: Diagnosis not present

## 2019-11-16 DIAGNOSIS — C4491 Basal cell carcinoma of skin, unspecified: Secondary | ICD-10-CM | POA: Diagnosis not present

## 2019-11-16 DIAGNOSIS — C44311 Basal cell carcinoma of skin of nose: Secondary | ICD-10-CM | POA: Diagnosis not present

## 2019-11-16 DIAGNOSIS — Z08 Encounter for follow-up examination after completed treatment for malignant neoplasm: Secondary | ICD-10-CM | POA: Diagnosis not present

## 2019-11-16 DIAGNOSIS — Z7189 Other specified counseling: Secondary | ICD-10-CM | POA: Diagnosis not present

## 2019-11-16 DIAGNOSIS — Z85828 Personal history of other malignant neoplasm of skin: Secondary | ICD-10-CM | POA: Diagnosis not present

## 2019-11-16 DIAGNOSIS — H61032 Chondritis of left external ear: Secondary | ICD-10-CM | POA: Diagnosis not present

## 2019-11-16 DIAGNOSIS — L7211 Pilar cyst: Secondary | ICD-10-CM | POA: Diagnosis not present

## 2019-11-16 DIAGNOSIS — L308 Other specified dermatitis: Secondary | ICD-10-CM | POA: Diagnosis not present

## 2019-11-16 DIAGNOSIS — D239 Other benign neoplasm of skin, unspecified: Secondary | ICD-10-CM | POA: Diagnosis not present

## 2019-11-16 DIAGNOSIS — L400 Psoriasis vulgaris: Secondary | ICD-10-CM | POA: Diagnosis not present

## 2019-11-17 DIAGNOSIS — R609 Edema, unspecified: Secondary | ICD-10-CM | POA: Diagnosis not present

## 2019-11-17 DIAGNOSIS — I10 Essential (primary) hypertension: Secondary | ICD-10-CM | POA: Diagnosis not present

## 2019-11-17 DIAGNOSIS — M1990 Unspecified osteoarthritis, unspecified site: Secondary | ICD-10-CM | POA: Diagnosis not present

## 2019-11-17 DIAGNOSIS — I872 Venous insufficiency (chronic) (peripheral): Secondary | ICD-10-CM | POA: Diagnosis not present

## 2019-11-17 DIAGNOSIS — E119 Type 2 diabetes mellitus without complications: Secondary | ICD-10-CM | POA: Diagnosis not present

## 2019-11-17 DIAGNOSIS — Z6379 Other stressful life events affecting family and household: Secondary | ICD-10-CM | POA: Diagnosis not present

## 2019-11-17 DIAGNOSIS — E669 Obesity, unspecified: Secondary | ICD-10-CM | POA: Diagnosis not present

## 2019-11-17 DIAGNOSIS — Z794 Long term (current) use of insulin: Secondary | ICD-10-CM | POA: Diagnosis not present

## 2019-11-24 DIAGNOSIS — Z952 Presence of prosthetic heart valve: Secondary | ICD-10-CM | POA: Diagnosis not present

## 2019-11-24 DIAGNOSIS — I10 Essential (primary) hypertension: Secondary | ICD-10-CM | POA: Diagnosis not present

## 2019-11-24 DIAGNOSIS — I350 Nonrheumatic aortic (valve) stenosis: Secondary | ICD-10-CM | POA: Diagnosis not present

## 2019-11-24 DIAGNOSIS — E785 Hyperlipidemia, unspecified: Secondary | ICD-10-CM | POA: Diagnosis not present

## 2019-12-09 DIAGNOSIS — M5416 Radiculopathy, lumbar region: Secondary | ICD-10-CM | POA: Diagnosis not present

## 2019-12-09 DIAGNOSIS — G894 Chronic pain syndrome: Secondary | ICD-10-CM | POA: Diagnosis not present

## 2019-12-15 DIAGNOSIS — Z6831 Body mass index (BMI) 31.0-31.9, adult: Secondary | ICD-10-CM | POA: Diagnosis not present

## 2019-12-15 DIAGNOSIS — Z01419 Encounter for gynecological examination (general) (routine) without abnormal findings: Secondary | ICD-10-CM | POA: Diagnosis not present

## 2019-12-15 DIAGNOSIS — Z1231 Encounter for screening mammogram for malignant neoplasm of breast: Secondary | ICD-10-CM | POA: Diagnosis not present

## 2019-12-16 ENCOUNTER — Other Ambulatory Visit: Payer: Self-pay | Admitting: Obstetrics and Gynecology

## 2019-12-16 DIAGNOSIS — R928 Other abnormal and inconclusive findings on diagnostic imaging of breast: Secondary | ICD-10-CM

## 2019-12-17 ENCOUNTER — Ambulatory Visit
Admission: RE | Admit: 2019-12-17 | Discharge: 2019-12-17 | Disposition: A | Discharge status: Home / Self Care | Payer: Medicare Other | Source: Ambulatory Visit | Attending: Obstetrics and Gynecology | Admitting: Obstetrics and Gynecology

## 2019-12-17 ENCOUNTER — Other Ambulatory Visit: Payer: Self-pay

## 2019-12-17 ENCOUNTER — Ambulatory Visit: Payer: Medicare Other

## 2019-12-17 DIAGNOSIS — R928 Other abnormal and inconclusive findings on diagnostic imaging of breast: Secondary | ICD-10-CM

## 2019-12-20 DIAGNOSIS — E1140 Type 2 diabetes mellitus with diabetic neuropathy, unspecified: Secondary | ICD-10-CM | POA: Diagnosis not present

## 2019-12-20 DIAGNOSIS — L84 Corns and callosities: Secondary | ICD-10-CM | POA: Diagnosis not present

## 2019-12-24 DIAGNOSIS — H6123 Impacted cerumen, bilateral: Secondary | ICD-10-CM | POA: Diagnosis not present

## 2019-12-24 DIAGNOSIS — H903 Sensorineural hearing loss, bilateral: Secondary | ICD-10-CM | POA: Diagnosis not present

## 2019-12-24 DIAGNOSIS — R0982 Postnasal drip: Secondary | ICD-10-CM | POA: Diagnosis not present

## 2019-12-24 DIAGNOSIS — J342 Deviated nasal septum: Secondary | ICD-10-CM | POA: Diagnosis not present

## 2020-01-19 DIAGNOSIS — Z23 Encounter for immunization: Secondary | ICD-10-CM | POA: Diagnosis not present

## 2020-04-04 DIAGNOSIS — H35033 Hypertensive retinopathy, bilateral: Secondary | ICD-10-CM | POA: Diagnosis not present

## 2020-04-04 DIAGNOSIS — H35372 Puckering of macula, left eye: Secondary | ICD-10-CM | POA: Diagnosis not present

## 2020-04-04 DIAGNOSIS — Z961 Presence of intraocular lens: Secondary | ICD-10-CM | POA: Diagnosis not present

## 2020-04-04 DIAGNOSIS — I10 Essential (primary) hypertension: Secondary | ICD-10-CM | POA: Diagnosis not present

## 2020-04-04 DIAGNOSIS — E113213 Type 2 diabetes mellitus with mild nonproliferative diabetic retinopathy with macular edema, bilateral: Secondary | ICD-10-CM | POA: Diagnosis not present

## 2020-04-05 DIAGNOSIS — Z800 Family history of malignant neoplasm of digestive organs: Secondary | ICD-10-CM | POA: Diagnosis not present

## 2020-04-05 DIAGNOSIS — K2270 Barrett's esophagus without dysplasia: Secondary | ICD-10-CM | POA: Diagnosis not present

## 2020-04-07 DIAGNOSIS — K2270 Barrett's esophagus without dysplasia: Secondary | ICD-10-CM | POA: Diagnosis not present

## 2020-05-11 DIAGNOSIS — G8929 Other chronic pain: Secondary | ICD-10-CM | POA: Diagnosis not present

## 2020-05-11 DIAGNOSIS — M5136 Other intervertebral disc degeneration, lumbar region: Secondary | ICD-10-CM | POA: Diagnosis not present

## 2020-05-11 DIAGNOSIS — Z5181 Encounter for therapeutic drug level monitoring: Secondary | ICD-10-CM | POA: Diagnosis not present

## 2020-05-11 DIAGNOSIS — M546 Pain in thoracic spine: Secondary | ICD-10-CM | POA: Diagnosis not present

## 2020-05-11 DIAGNOSIS — M5416 Radiculopathy, lumbar region: Secondary | ICD-10-CM | POA: Diagnosis not present

## 2020-05-24 DIAGNOSIS — L918 Other hypertrophic disorders of the skin: Secondary | ICD-10-CM | POA: Diagnosis not present

## 2020-05-24 DIAGNOSIS — L738 Other specified follicular disorders: Secondary | ICD-10-CM | POA: Diagnosis not present

## 2020-05-24 DIAGNOSIS — L400 Psoriasis vulgaris: Secondary | ICD-10-CM | POA: Diagnosis not present

## 2020-05-24 DIAGNOSIS — Z08 Encounter for follow-up examination after completed treatment for malignant neoplasm: Secondary | ICD-10-CM | POA: Diagnosis not present

## 2020-05-24 DIAGNOSIS — D239 Other benign neoplasm of skin, unspecified: Secondary | ICD-10-CM | POA: Diagnosis not present

## 2020-05-24 DIAGNOSIS — C44311 Basal cell carcinoma of skin of nose: Secondary | ICD-10-CM | POA: Diagnosis not present

## 2020-05-24 DIAGNOSIS — L7211 Pilar cyst: Secondary | ICD-10-CM | POA: Diagnosis not present

## 2020-05-24 DIAGNOSIS — Z85828 Personal history of other malignant neoplasm of skin: Secondary | ICD-10-CM | POA: Diagnosis not present

## 2020-05-29 DIAGNOSIS — H9190 Unspecified hearing loss, unspecified ear: Secondary | ICD-10-CM | POA: Insufficient documentation

## 2020-05-29 DIAGNOSIS — H6123 Impacted cerumen, bilateral: Secondary | ICD-10-CM | POA: Diagnosis not present

## 2020-05-29 DIAGNOSIS — H903 Sensorineural hearing loss, bilateral: Secondary | ICD-10-CM | POA: Diagnosis not present

## 2020-05-29 DIAGNOSIS — H9313 Tinnitus, bilateral: Secondary | ICD-10-CM | POA: Insufficient documentation

## 2020-05-30 DIAGNOSIS — H90A21 Sensorineural hearing loss, unilateral, right ear, with restricted hearing on the contralateral side: Secondary | ICD-10-CM | POA: Diagnosis not present

## 2020-05-30 DIAGNOSIS — H9313 Tinnitus, bilateral: Secondary | ICD-10-CM | POA: Diagnosis not present

## 2020-05-30 DIAGNOSIS — H90A32 Mixed conductive and sensorineural hearing loss, unilateral, left ear with restricted hearing on the contralateral side: Secondary | ICD-10-CM | POA: Diagnosis not present

## 2020-06-01 DIAGNOSIS — M40204 Unspecified kyphosis, thoracic region: Secondary | ICD-10-CM | POA: Diagnosis not present

## 2020-06-01 DIAGNOSIS — M5134 Other intervertebral disc degeneration, thoracic region: Secondary | ICD-10-CM | POA: Diagnosis not present

## 2020-06-01 DIAGNOSIS — M4854XA Collapsed vertebra, not elsewhere classified, thoracic region, initial encounter for fracture: Secondary | ICD-10-CM | POA: Diagnosis not present

## 2020-06-01 DIAGNOSIS — M5117 Intervertebral disc disorders with radiculopathy, lumbosacral region: Secondary | ICD-10-CM | POA: Diagnosis not present

## 2020-06-01 DIAGNOSIS — M438X4 Other specified deforming dorsopathies, thoracic region: Secondary | ICD-10-CM | POA: Diagnosis not present

## 2020-06-01 DIAGNOSIS — M5116 Intervertebral disc disorders with radiculopathy, lumbar region: Secondary | ICD-10-CM | POA: Diagnosis not present

## 2020-06-22 DIAGNOSIS — G8929 Other chronic pain: Secondary | ICD-10-CM | POA: Diagnosis not present

## 2020-06-22 DIAGNOSIS — M47817 Spondylosis without myelopathy or radiculopathy, lumbosacral region: Secondary | ICD-10-CM | POA: Diagnosis not present

## 2020-07-11 DIAGNOSIS — M47817 Spondylosis without myelopathy or radiculopathy, lumbosacral region: Secondary | ICD-10-CM | POA: Diagnosis not present

## 2020-07-11 DIAGNOSIS — G8929 Other chronic pain: Secondary | ICD-10-CM | POA: Diagnosis not present

## 2020-07-17 DIAGNOSIS — I872 Venous insufficiency (chronic) (peripheral): Secondary | ICD-10-CM | POA: Diagnosis not present

## 2020-07-17 DIAGNOSIS — I10 Essential (primary) hypertension: Secondary | ICD-10-CM | POA: Diagnosis not present

## 2020-07-17 DIAGNOSIS — E669 Obesity, unspecified: Secondary | ICD-10-CM | POA: Diagnosis not present

## 2020-07-17 DIAGNOSIS — Z6379 Other stressful life events affecting family and household: Secondary | ICD-10-CM | POA: Diagnosis not present

## 2020-07-17 DIAGNOSIS — E1065 Type 1 diabetes mellitus with hyperglycemia: Secondary | ICD-10-CM | POA: Diagnosis not present

## 2020-07-18 ENCOUNTER — Encounter: Payer: Self-pay | Admitting: Podiatry

## 2020-07-18 ENCOUNTER — Ambulatory Visit (INDEPENDENT_AMBULATORY_CARE_PROVIDER_SITE_OTHER): Payer: Medicare Other | Admitting: Podiatry

## 2020-07-18 ENCOUNTER — Other Ambulatory Visit: Payer: Self-pay

## 2020-07-18 DIAGNOSIS — G8929 Other chronic pain: Secondary | ICD-10-CM | POA: Insufficient documentation

## 2020-07-18 DIAGNOSIS — M79674 Pain in right toe(s): Secondary | ICD-10-CM | POA: Diagnosis not present

## 2020-07-18 DIAGNOSIS — M2041 Other hammer toe(s) (acquired), right foot: Secondary | ICD-10-CM | POA: Diagnosis not present

## 2020-07-18 DIAGNOSIS — E1142 Type 2 diabetes mellitus with diabetic polyneuropathy: Secondary | ICD-10-CM | POA: Diagnosis not present

## 2020-07-18 DIAGNOSIS — B351 Tinea unguium: Secondary | ICD-10-CM

## 2020-07-18 DIAGNOSIS — E119 Type 2 diabetes mellitus without complications: Secondary | ICD-10-CM | POA: Diagnosis not present

## 2020-07-18 DIAGNOSIS — M79675 Pain in left toe(s): Secondary | ICD-10-CM | POA: Diagnosis not present

## 2020-07-18 DIAGNOSIS — K219 Gastro-esophageal reflux disease without esophagitis: Secondary | ICD-10-CM | POA: Insufficient documentation

## 2020-07-18 DIAGNOSIS — L84 Corns and callosities: Secondary | ICD-10-CM | POA: Diagnosis not present

## 2020-07-18 DIAGNOSIS — M549 Dorsalgia, unspecified: Secondary | ICD-10-CM | POA: Insufficient documentation

## 2020-07-18 DIAGNOSIS — M2042 Other hammer toe(s) (acquired), left foot: Secondary | ICD-10-CM | POA: Diagnosis not present

## 2020-07-18 DIAGNOSIS — E785 Hyperlipidemia, unspecified: Secondary | ICD-10-CM | POA: Insufficient documentation

## 2020-07-18 NOTE — Patient Instructions (Signed)
Diabetes Mellitus and Foot Care Foot care is an important part of your health, especially when you have diabetes. Diabetes may cause you to have problems because of poor blood flow (circulation) to your feet and legs, which can cause your skin to:  Become thinner and drier.  Break more easily.  Heal more slowly.  Peel and crack. You may also have nerve damage (neuropathy) in your legs and feet, causing decreased feeling in them. This means that you may not notice minor injuries to your feet that could lead to more serious problems. Noticing and addressing any potential problems early is the best way to prevent future foot problems. How to care for your feet Foot hygiene  Wash your feet daily with warm water and mild soap. Do not use hot water. Then, pat your feet and the areas between your toes until they are completely dry. Do not soak your feet as this can dry your skin.  Trim your toenails straight across. Do not dig under them or around the cuticle. File the edges of your nails with an emery board or nail file.  Apply a moisturizing lotion or petroleum jelly to the skin on your feet and to dry, brittle toenails. Use lotion that does not contain alcohol and is unscented. Do not apply lotion between your toes. Shoes and socks  Wear clean socks or stockings every day. Make sure they are not too tight. Do not wear knee-high stockings since they may decrease blood flow to your legs.  Wear shoes that fit properly and have enough cushioning. Always look in your shoes before you put them on to be sure there are no objects inside.  To break in new shoes, wear them for just a few hours a day. This prevents injuries on your feet. Wounds, scrapes, corns, and calluses  Check your feet daily for blisters, cuts, bruises, sores, and redness. If you cannot see the bottom of your feet, use a mirror or ask someone for help.  Do not cut corns or calluses or try to remove them with medicine.  If you  find a minor scrape, cut, or break in the skin on your feet, keep it and the skin around it clean and dry. You may clean these areas with mild soap and water. Do not clean the area with peroxide, alcohol, or iodine.  If you have a wound, scrape, corn, or callus on your foot, look at it several times a day to make sure it is healing and not infected. Check for: ? Redness, swelling, or pain. ? Fluid or blood. ? Warmth. ? Pus or a bad smell. General instructions  Do not cross your legs. This may decrease blood flow to your feet.  Do not use heating pads or hot water bottles on your feet. They may burn your skin. If you have lost feeling in your feet or legs, you may not know this is happening until it is too late.  Protect your feet from hot and cold by wearing shoes, such as at the beach or on hot pavement.  Schedule a complete foot exam at least once a year (annually) or more often if you have foot problems. If you have foot problems, report any cuts, sores, or bruises to your health care provider immediately. Contact a health care provider if:  You have a medical condition that increases your risk of infection and you have any cuts, sores, or bruises on your feet.  You have an injury that is not   healing.  You have redness on your legs or feet.  You feel burning or tingling in your legs or feet.  You have pain or cramps in your legs and feet.  Your legs or feet are numb.  Your feet always feel cold.  You have pain around a toenail. Get help right away if:  You have a wound, scrape, corn, or callus on your foot and: ? You have pain, swelling, or redness that gets worse. ? You have fluid or blood coming from the wound, scrape, corn, or callus. ? Your wound, scrape, corn, or callus feels warm to the touch. ? You have pus or a bad smell coming from the wound, scrape, corn, or callus. ? You have a fever. ? You have a red line going up your leg. Summary  Check your feet every day  for cuts, sores, red spots, swelling, and blisters.  Moisturize feet and legs daily.  Wear shoes that fit properly and have enough cushioning.  If you have foot problems, report any cuts, sores, or bruises to your health care provider immediately.  Schedule a complete foot exam at least once a year (annually) or more often if you have foot problems. This information is not intended to replace advice given to you by your health care provider. Make sure you discuss any questions you have with your health care provider. Document Revised: 05/12/2019 Document Reviewed: 09/20/2016 Elsevier Patient Education  [place].   Diabetic Neuropathy Diabetic neuropathy refers to nerve damage that is caused by diabetes (diabetes mellitus). Over time, people with diabetes can develop nerve damage throughout the body. There are several types of diabetic neuropathy:  Peripheral neuropathy. This is the most common type of diabetic neuropathy. It causes damage to nerves that carry signals between the spinal cord and other parts of the body (peripheral nerves). This usually affects nerves in the feet and legs first, and may eventually affect the hands and arms. The damage affects the ability to sense touch or temperature.  Autonomic neuropathy. This type causes damage to nerves that control involuntary functions (autonomic nerves). These nerves carry signals that control: ? Heartbeat. ? Body temperature. ? Blood pressure. ? Urination. ? Digestion. ? Sweating. ? Sexual function. ? Response to changing blood sugar (glucose) levels.  Focal neuropathy. This type of nerve damage affects one area of the body, such as an arm, a leg, or the face. The injury may involve one nerve or a small group of nerves. Focal neuropathy can be painful and unpredictable, and occurs most often in older adults with diabetes. This often develops suddenly, but usually improves over time and does not cause long-term  problems.  Proximal neuropathy. This type of nerve damage affects the nerves of the thighs, hips, buttocks, or legs. It causes severe pain, weakness, and muscle death (atrophy), usually in the thigh muscles. It is more common among older men and people who have type 2 diabetes. The length of recovery time may vary. What are the causes? Peripheral, autonomic, and focal neuropathies are caused by diabetes that is not well controlled with treatment. The cause of proximal neuropathy is not known, but it may be caused by inflammation related to uncontrolled blood glucose levels. What are the signs or symptoms? Peripheral neuropathy Peripheral neuropathy develops slowly over time. When the nerves of the feet and legs no longer work, you may experience:  Burning, stabbing, or aching pain in the legs or feet.  Pain or cramping in the legs or feet.  Loss of feeling (numbness) and inability to feel pressure or pain in the feet. This can lead to: ? Thick calluses or sores on areas of constant pressure. ? Ulcers. ? Reduced ability to feel temperature changes.  Foot deformities.  Muscle weakness.  Loss of balance or coordination. Autonomic neuropathy The symptoms of autonomic neuropathy vary depending on which nerves are affected. Symptoms may include:  Problems with digestion, such as: ? Nausea or vomiting. ? Poor appetite. ? Bloating. ? Diarrhea or constipation. ? Trouble swallowing. ? Losing weight without trying to.  Problems with the heart, blood and lungs, such as: ? Dizziness, especially when standing up. ? Fainting. ? Shortness of breath. ? Irregular heartbeat.  Bladder problems, such as: ? Trouble starting or stopping urination. ? Leaking urine. ? Trouble emptying the bladder. ? Urinary tract infections (UTIs).  Problems with other body functions, such as: ? Sweat. You may sweat too much or too little. ? Temperature. You might get hot easily. Or, you might feel cold more  than usual. ? Sexual function. Men may not be able to get or maintain an erection. Women may have vaginal dryness and difficulty with arousal. Focal neuropathy Symptoms affect only one area of the body. Common symptoms include:  Numbness.  Tingling.  Burning pain.  Prickling feeling.  Very sensitive skin.  Weakness.  Inability to move (paralysis).  Muscle twitching.  Muscles getting smaller (wasting).  Poor coordination.  Double or blurred vision. Proximal neuropathy  Sudden, severe pain in the hip, thigh, or buttocks. Pain may spread from the back into the legs (sciatica).  Pain and numbness in the arms and legs.  Tingling.  Loss of bladder control or bowel control.  Weakness and wasting of thigh muscles.  Difficulty getting up from a seated position.  Abdominal swelling.  Unexplained weight loss. How is this diagnosed? Diagnosis usually involves reviewing your medical history and any symptoms you have. Diagnosis varies depending on the type of neuropathy your health care provider suspects. Peripheral neuropathy Your health care provider will check areas that are affected by your nervous system (neurologic exam), such as your reflexes, how you move, and what you can feel. You may have other tests, such as:  Blood tests.  Removal and examination of fluid that surrounds the spinal cord (lumbar puncture).  CT scan.  MRI.  A test to check the nerves that control muscles (electromyogram, EMG).  Tests of how quickly messages pass through your nerves (nerve conduction velocity tests).  Removal of a small piece of nerve to be examined under a microscope (biopsy). Autonomic neuropathy You may have tests, such as:  Tests to measure your blood pressure and heart rate. This may include monitoring you while you are safely secured to an exam table that moves you from a lying position to an upright position (table tilt test).  Breathing tests to check your  lungs.  Tests to check how food moves through the digestive system (gastric emptying tests).  Blood, sweat, or urine tests.  Ultrasound of your bladder.  Spinal fluid tests. Focal neuropathy This condition may be diagnosed with:  A neurologic exam.  CT scan.  MRI.  EMG.  Nerve conduction velocity tests. Proximal neuropathy There is no test to diagnose this type of neuropathy. You may have tests to rule out other possible causes of this type of neuropathy. Tests may include:  X-rays of your spine and lumbar region.  Lumbar puncture.  MRI. How is this treated? The goal of treatment is  to keep nerve damage from getting worse. The most important part of treatment is keeping your blood glucose level and your A1C level within your target range by following your diabetes management plan. Over time, maintaining lower blood glucose levels helps lessen symptoms. In some cases, you may need prescription pain medicine. Follow these instructions at home:  Lifestyle   Do not use any products that contain nicotine or tobacco, such as cigarettes and e-cigarettes. If you need help quitting, ask your health care provider.  Be physically active every day. Include strength training and balance exercises.  Follow a healthy meal plan.  Work with your health care provider to manage your blood pressure. General instructions  Follow your diabetes management plan as directed. ? Check your blood glucose levels as directed by your health care provider. ? Keep your blood glucose in your target range as directed by your health care provider. ? Have your A1C level checked at least two times a year, or as often as told by your health care provider.  Take over the counter and prescription medicines only as told by your health care provider. This includes insulin and diabetes medicine.  Do not drive or use heavy machinery while taking prescription pain medicines.  Check your skin and feet every  day for cuts, bruises, redness, blisters, or sores.  Keep all follow up visits as told by your health care provider. This is important. Contact a health care provider if:  You have burning, stabbing, or aching pain in your legs or feet.  You are unable to feel pressure or pain in your feet.  You develop problems with digestion, such as: ? Nausea. ? Vomiting. ? Bloating. ? Constipation. ? Diarrhea. ? Abdominal pain.  You have difficulty with urination, such as inability: ? To control when you urinate (incontinence). ? To completely empty the bladder (retention).  You have palpitations.  You feel dizzy, weak, or faint when you stand up. Get help right away if:  You cannot urinate.  You have sudden weakness or loss of coordination.  You have trouble speaking.  You have pain or pressure in your chest.  You have an irregular heart beat.  You have sudden inability to move a part of your body. Summary  Diabetic neuropathy refers to nerve damage that is caused by diabetes. It can affect nerves throughout the entire body, causing numbness and pain in the arms, legs, digestive tract, heart, and other body systems.  Keep your blood glucose level and your blood pressure in your target range, as directed by your health care provider. This can help prevent neuropathy from getting worse.  Check your skin and feet every day for cuts, bruises, redness, blisters, or sores.  Do not use any products that contain nicotine or tobacco, such as cigarettes and e-cigarettes. If you need help quitting, ask your health care provider. This information is not intended to replace advice given to you by your health care provider. Make sure you discuss any questions you have with your health care provider. Document Revised: 10/01/2017 Document Reviewed: 09/23/2016 Elsevier Patient Education  [place]  Hammer toe is a change in the shape (a deformity) of your toe. The  deformity causes the middle joint of your toe to stay bent. This causes pain, especially when you are wearing shoes. Hammer toe starts gradually. At first, the toe can be straightened. Gradually over time, the deformity becomes stiff and permanent. Early treatments to keep the toe straight  may relieve pain. As the deformity becomes stiff and permanent, surgery may be needed to straighten the toe. What are the causes? Hammer toe is caused by abnormal bending of the toe joint that is closest to your foot. It happens gradually over time. This pulls on the muscles and connections (tendons) of the toe joint, making them weak and stiff. It is often related to wearing shoes that are too short or narrow and do not let your toes straighten. What increases the risk? You may be at greater risk for hammer toe if you:  Are female.  Are older.  Wear shoes that are too small.  Wear high-heeled shoes that pinch your toes.  Are a Engineer, mining.  Have a second toe that is longer than your big toe (first toe).  Injure your foot or toe.  Have arthritis.  Have a family history of hammer toe.  Have a nerve or muscle disorder. What are the signs or symptoms? The main symptoms of this condition are pain and deformity of the toe. The pain is worse when wearing shoes, walking, or running. Other symptoms may include:  Corns or calluses over the bent part of the toe or between the toes.  Redness and a burning feeling on the toe.  An open sore that forms on the top of the toe.  Not being able to straighten the toe. How is this diagnosed? This condition is diagnosed based on your symptoms and a physical exam. During the exam, your health care provider will try to straighten your toe to see how stiff the deformity is. You may also have tests, such as:  A blood test to check for rheumatoid arthritis.  An X-ray to show how severe the deformity is. How is this treated? Treatment for this condition will  depend on how stiff the deformity is. Surgery is often needed. However, sometimes a hammer toe can be straightened without surgery. Treatments that do not involve surgery include:  Taping the toe into a straightened position.  Using pads and cushions to protect the toe (orthotics).  Wearing shoes that provide enough room for the toes.  Doing toe-stretching exercises at home.  Taking an NSAID to reduce pain and swelling. If these treatments do not help or the toe cannot be straightened, surgery is the next option. The most common surgeries used to straighten a hammer toe include:  Arthroplasty. In this procedure, part of the joint is removed, and that allows the toe to straighten.  Fusion. In this procedure, cartilage between the two bones of the joint is taken out and the bones are fused together into one longer bone.  Implantation. In this procedure, part of the bone is removed and replaced with an implant to let the toe move again.  Flexor tendon transfer. In this procedure, the tendons that curl the toes down (flexor tendons) are repositioned. Follow these instructions at home:  Take over-the-counter and prescription medicines only as told by your health care provider.  Do toe straightening and stretching exercises as told by your health care provider.  Keep all follow-up visits as told by your health care provider. This is important. How is this prevented?  Wear shoes that give your toes enough room and do not cause pain.  Do not wear high-heeled shoes. Contact a health care provider if:  Your pain gets worse.  Your toe becomes red or swollen.  You develop an open sore on your toe. This information is not intended to replace advice given  to you by your health care provider. Make sure you discuss any questions you have with your health care provider. Document Revised: 08/01/2017 Document Reviewed: 12/13/2015 Elsevier Patient Education  [place] are small areas of thickened skin that occur on the top, sides, or tip of a toe. They contain a cone-shaped core with a point that can press on a nerve below. This causes pain.  Calluses are areas of thickened skin that can occur anywhere on the body, including the hands, fingers, palms, soles of the feet, and heels. Calluses are usually larger than corns. What are the causes? Corns and calluses are caused by rubbing (friction) or pressure, such as from shoes that are too tight or do not fit properly. What increases the risk? Corns are more likely to develop in people who have misshapen toes (toe deformities), such as hammer toes. Calluses can occur with friction to any area of the skin. They are more likely to develop in people who:  Work with their hands.  Wear shoes that fit poorly, are too tight, or are high-heeled.  Have toe deformities. What are the signs or symptoms? Symptoms of a corn or callus include:  A hard growth on the skin.  Pain or tenderness under the skin.  Redness and swelling.  Increased discomfort while wearing tight-fitting shoes, if your feet are affected. If a corn or callus becomes infected, symptoms may include:  Redness and swelling that gets worse.  Pain.  Fluid, blood, or pus draining from the corn or callus. How is this diagnosed? Corns and calluses may be diagnosed based on your symptoms, your medical history, and a physical exam. How is this treated? Treatment for corns and calluses may include:  Removing the cause of the friction or pressure. This may involve: ? Changing your shoes. ? Wearing shoe inserts (orthotics) or other protective layers in your shoes, such as a corn pad. ? Wearing gloves.  Applying medicine to the skin (topical medicine) to help soften skin in the hardened, thickened areas.  Removing layers of dead skin with a file to reduce the size of the corn or callus.  Removing the corn or callus with a scalpel  or laser.  Taking antibiotic medicines, if your corn or callus is infected.  Having surgery, if a toe deformity is the cause. Follow these instructions at home:   Take over-the-counter and prescription medicines only as told by your health care provider.  If you were prescribed an antibiotic, take it as told by your health care provider. Do not stop taking it even if your condition starts to improve.  Wear shoes that fit well. Avoid wearing high-heeled shoes and shoes that are too tight or too loose.  Wear any padding, protective layers, gloves, or orthotics as told by your health care provider.  Soak your hands or feet and then use a file or pumice stone to soften your corn or callus. Do this as told by your health care provider.  Check your corn or callus every day for symptoms of infection. Contact a health care provider if you:  Notice that your symptoms do not improve with treatment.  Have redness or swelling that gets worse.  Notice that your corn or callus becomes painful.  Have fluid, blood, or pus coming from your corn or callus.  Have new symptoms. Summary  Corns are small areas of thickened skin that occur on the top, sides, or tip of a toe.  Calluses are areas of thickened skin that can occur anywhere on the body, including the hands, fingers, palms, and soles of the feet. Calluses are usually larger than corns.  Corns and calluses are caused by rubbing (friction) or pressure, such as from shoes that are too tight or do not fit properly.  Treatment may include wearing any padding, protective layers, gloves, or orthotics as told by your health care provider. This information is not intended to replace advice given to you by your health care provider. Make sure you discuss any questions you have with your health care provider. Document Revised: 12/09/2018 Document Reviewed: 07/02/2017 Elsevier Patient Education  2020 [employer].

## 2020-07-19 ENCOUNTER — Ambulatory Visit: Payer: Medicare Other | Admitting: Podiatry

## 2020-07-22 ENCOUNTER — Encounter: Payer: Self-pay | Admitting: Podiatry

## 2020-07-22 NOTE — Progress Notes (Addendum)
Subjective: Amy Russo presents today referred by Amy Bunting, MD for diabetic foot evaluation.  Patient relates 30 year history of diabetes. She states she hasn't been very compliant with her diet.   Her blood glucose was 75 mg/dl today. Her last A1c was 9.6%.  Patient denies any history of foot wounds.  Patient does relate symptoms of numbness and tingling in her feet.   Today, patient c/o of painful, discolored, thick toenails which interfere with daily activities.  Pain is aggravated when wearing enclosed shoe gear.   PCP is Dr. Burnard Russo and last visit was 11/17/2019.  Past Medical History:  Diagnosis Date  . 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 Arthritis   . Barrett's esophagus   . Carpal tunnel syndrome    right  . Cataract    immature and not sure which eye  . Chronic back pain    spondylolisthesis/DDD/stenosis  . Diabetes mellitus    takes Novolog and Lantus daily  . Diverticulosis   . Esophageal stricture   . GERD (gastroesophageal reflux disease)    takes Omeprazole daily  . History of blood transfusion    no abnormal reaction noted  . History of colon polyps    benign  . History of shingles   . HTN (hypertension)    takes Lisinopril daily  . Hyperlipidemia    takes Atorvastatin daily  . Hypothyroidism    takes Synthroid daily  . Insomnia    Takes Restoril daily  . Joint pain   . Joint swelling   . Psoriasis   . Weakness    numbness and tingling    Patient Active Problem List   Diagnosis Date Noted  . GERD (gastroesophageal reflux disease) 07/18/2020  . Chronic back pain 07/18/2020  . Hyperlipidemia 07/18/2020  . Tinnitus, bilateral 05/29/2020  . Hearing loss 05/29/2020  . Bilateral impacted cerumen 02/10/2017  . Hypertensive retinopathy of both eyes 01/09/2016  . Spondylolisthesis of lumbar region 11/17/2015  . Elbow joint pain 04/18/2015  . Nuclear cataract of both eyes 10/18/2014  . 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[place])  09/15/2013  . Epiretinal membrane, left eye 02/16/2013  . S/P AVR (aortic valve replacement) 09/27/2011  . Aortic valve disease   . Diabetes mellitus   . HTN (hypertension)   . Obesity     Past Surgical History:  Procedure Laterality Date  . AVR  12/2006   #23 [facility] pericardial valve  . AXILLARY ARTERY ANEURYSM REPAIR Left 1958  . CARPAL TUNNEL RELEASE Left   . COLONOSCOPY    . cyst removed from left ear    . ESOPHAGOGASTRODUODENOSCOPY    . [place]  . TONSILLECTOMY  1955  . TRIGGER FINGER RELEASE Left   . TUBAL LIGATION      Current Outpatient Medications on File Prior to Visit  Medication Sig Dispense Refill  . amoxicillin (AMOXIL) 500 MG capsule Take 1,000 mg by mouth 2 (two) times daily.    Marland Kitchen aspirin 81 MG tablet Take 81 mg by mouth daily.     Marland Kitchen atorvastatin (LIPITOR) 20 MG tablet Take 1 tablet by mouth daily.  0  . Calcium Carbonate-Vitamin D (CALCIUM + D PO) Take 600-800 mg by mouth daily.    . Cyanocobalamin (VITAMIN B-12 PO) Take 1,000 mcg by mouth daily.     Marland Kitchen escitalopram (LEXAPRO) 5 MG tablet Take 5 mg by mouth daily.    . hydrochlorothiazide (HYDRODIURIL) 25 MG tablet Take 25 mg  by mouth daily.    . insulin aspart (NOVOLOG) 100 UNIT/ML injection Novolog U-100 Insulin aspart 100 unit/mL subcutaneous solution  INJECT UP TO 33 UNITS PER DAY IN 4 DIVIDED DOSES AS DIRECTED. DX  E10.65    . insulin detemir (LEVEMIR) 100 UNIT/ML injection Levemir U-100 Insulin 100 unit/mL subcutaneous solution  INJECT 35 UNITS AT BEDTIME AS DIRECTED    . Insulin Syringe-Needle U-100 (BD INSULIN SYRINGE U/F) 30G X 1/2" 0.5 ML MISC BD Insulin Syringe Ultra-Fine 0.5 mL 31 gauge x 5/16"  USE TO INJECT INSULIN UPTO 5TIMES A DAY E10.65    . LEVEMIR 100 UNIT/ML injection Inject 30 Units into the skin at bedtime.     Marland Kitchen levothyroxine (SYNTHROID) 75 MCG tablet Synthroid 75 mcg tablet  TAKE 1 TABLET BY MOUTH EVERY DAY    . lisinopril (ZESTRIL) 20 MG tablet lisinopril 20 mg  tablet  TAKE 1 TABLET BY MOUTH TWICE A DAY    . methylPREDNISolone (MEDROL DOSEPAK) 4 MG TBPK tablet Take by mouth.    . Multiple Vitamin (MULTI-VITAMIN PO) Take 1 tablet by mouth daily.     Glory Rosebush ULTRA test strip 1 each 3 (three) times daily.    Marland Kitchen oxyCODONE-acetaminophen (PERCOCET/ROXICET) 5-325 MG tablet Take 1-2 tablets by mouth every 6 (six) hours as needed for moderate pain. 70 tablet 0  . Pyridoxine HCl (VITAMIN B-6 PO) Take 200 mg by mouth daily.     . temazepam (RESTORIL) 15 MG capsule Take 1-2 capsules by mouth at bedtime as needed.   2  . tiZANidine (ZANAFLEX) 4 MG tablet Take 1 tablet (4 mg total) by mouth every 6 (six) hours as needed for muscle spasms. 60 tablet 0  . Turmeric 500 MG CAPS Take 2 capsules by mouth daily.     No current facility-administered medications on file prior to visit.     Allergies  Allergen Reactions  . Epinephrine Palpitations and Other (See Comments)    Rapid heart beat [Expected]    Social History   Occupational History  . Not on file  Tobacco Use  . Smoking status: Former Research scientist (life sciences)  . Smokeless tobacco: Never Used  . Tobacco comment: quit smoking 30+ yrs ago  Substance and Sexual Activity  . Alcohol use: Yes    Alcohol/week: 0.0 standard drinks    Comment: ocassionally  . Drug use: No  . Sexual activity: Yes    History reviewed. No pertinent family history.   There is no immunization history on file for this patient.  Objective: There were no vitals filed for this visit.  Amy Russo is a pleasant 69 y.o. female obese in NAD. AAO X 3.  Vascular Examination: Capillary fill time to digits <3 seconds b/l lower extremities. Palpable pedal pulses b/l LE. Pedal hair sparse. Lower extremity skin temperature gradient within normal limits. No pain with calf compression b/l.  Dermatological Examination: Pedal skin with normal turgor, texture and tone bilaterally. No open wounds bilaterally. No interdigital macerations bilaterally.  Toenails 1-5 b/l elongated, discolored, dystrophic, thickened, crumbly with subungual debris and tenderness to dorsal palpation. Hyperkeratotic lesion(s) left foot and right foot.  No erythema, no edema, no drainage, no fluctuance.  Musculoskeletal Examination: Normal muscle strength 5/5 to all lower extremity muscle groups bilaterally. No pain crepitus or joint limitation noted with ROM b/l. Hammertoes noted to the 1-5 bilaterally. Patient ambulates independent of any assistive aids.  Footwear Assessment: Does the patient wear appropriate shoes? Yes. Does the patient need inserts/orthotics? Yes..  Neurological  Examination: Protective sensation diminished with 10g monofilament b/l. Vibratory sensation decreased b/l. Proprioception intact bilaterally.  Assessment: 1. Pain due to onychomycosis of toenails of both feet   2. Callus   3. Acquired hammertoes of both feet   4. Diabetic peripheral neuropathy associated with type 2 diabetes mellitus ([place])   5. Encounter for diabetic foot exam ([place])     ADA Risk Categorization:  Low Risk:  Patient has all of the following: Intact protective sensation No prior foot ulcer  No severe deformity Pedal pulses present  Plan: -Examined patient. -Diabetic foot examination performed on today's visit. -Discussed diabetic foot care principles. Literature dispensed on today. -Patient to continue soft, supportive shoe gear daily. Start procedure for diabetic shoes. Patient qualifies based on diagnoses. -Toenails 1-5 b/l were debrided in length and girth with sterile nail nippers and dremel without iatrogenic bleeding.  -Callus(es) left foot and right foot pared utilizing sterile scalpel blade without complication or incident. Total number debrided =2. -Patient to report any pedal injuries to medical professional immediately. -Patient/POA to call should there be question/concern in the interim.  Return in about 3 months (around 10/18/2020) for diabetic  toenails, corn(s)/callus(es).

## 2020-08-09 DIAGNOSIS — G5601 Carpal tunnel syndrome, right upper limb: Secondary | ICD-10-CM | POA: Diagnosis not present

## 2020-10-27 ENCOUNTER — Ambulatory Visit (INDEPENDENT_AMBULATORY_CARE_PROVIDER_SITE_OTHER): Payer: Medicare Other | Admitting: Podiatry

## 2020-10-27 ENCOUNTER — Ambulatory Visit: Payer: Medicare Other

## 2020-10-27 ENCOUNTER — Other Ambulatory Visit: Payer: Self-pay

## 2020-10-27 DIAGNOSIS — E1142 Type 2 diabetes mellitus with diabetic polyneuropathy: Secondary | ICD-10-CM

## 2020-10-27 DIAGNOSIS — M79675 Pain in left toe(s): Secondary | ICD-10-CM | POA: Diagnosis not present

## 2020-10-27 DIAGNOSIS — M216X2 Other acquired deformities of left foot: Secondary | ICD-10-CM

## 2020-10-27 DIAGNOSIS — B351 Tinea unguium: Secondary | ICD-10-CM | POA: Diagnosis not present

## 2020-10-27 DIAGNOSIS — M79674 Pain in right toe(s): Secondary | ICD-10-CM | POA: Diagnosis not present

## 2020-10-27 DIAGNOSIS — L84 Corns and callosities: Secondary | ICD-10-CM | POA: Diagnosis not present

## 2020-10-27 DIAGNOSIS — Q828 Other specified congenital malformations of skin: Secondary | ICD-10-CM | POA: Diagnosis not present

## 2020-10-27 DIAGNOSIS — M2042 Other hammer toe(s) (acquired), left foot: Secondary | ICD-10-CM

## 2020-10-27 DIAGNOSIS — E119 Type 2 diabetes mellitus without complications: Secondary | ICD-10-CM

## 2020-10-27 DIAGNOSIS — M2041 Other hammer toe(s) (acquired), right foot: Secondary | ICD-10-CM

## 2020-10-27 DIAGNOSIS — M216X1 Other acquired deformities of right foot: Secondary | ICD-10-CM

## 2020-10-27 DIAGNOSIS — M2011 Hallux valgus (acquired), right foot: Secondary | ICD-10-CM

## 2020-10-27 DIAGNOSIS — M2012 Hallux valgus (acquired), left foot: Secondary | ICD-10-CM

## 2020-10-29 ENCOUNTER — Encounter: Payer: Self-pay | Admitting: Podiatry

## 2020-10-29 DIAGNOSIS — K5730 Diverticulosis of large intestine without perforation or abscess without bleeding: Secondary | ICD-10-CM | POA: Insufficient documentation

## 2020-10-29 DIAGNOSIS — K2100 Gastro-esophageal reflux disease with esophagitis, without bleeding: Secondary | ICD-10-CM | POA: Insufficient documentation

## 2020-10-29 DIAGNOSIS — K625 Hemorrhage of anus and rectum: Secondary | ICD-10-CM | POA: Insufficient documentation

## 2020-10-29 DIAGNOSIS — K5900 Constipation, unspecified: Secondary | ICD-10-CM | POA: Insufficient documentation

## 2020-10-29 DIAGNOSIS — Z800 Family history of malignant neoplasm of digestive organs: Secondary | ICD-10-CM | POA: Insufficient documentation

## 2020-10-29 NOTE — Progress Notes (Signed)
Subjective:  Patient ID: Amy Russo, female    DOB: 1951-05-02,  MRN: [id number]  70 y.o. female presents with at risk foot care with history of diabetic neuropathy and callus(es) b/l feet and painful thick toenails that are difficult to trim. Painful toenails interfere with ambulation. Aggravating factors include wearing enclosed shoe gear. Pain is relieved with periodic professional debridement. Painful calluses are aggravated when weightbearing with and without shoegear. Pain is relieved with periodic professional debridement.   She is also here to pick up her diabetic shoes today.  Patient's blood sugar was 196 mg/dl this morning.  PCP: Burnard Bunting, MD and last visit was: 07/17/2020.  Review of Systems: Negative except as noted in the HPI.   Allergies  Allergen Reactions  . Epinephrine Palpitations and Other (See Comments)    Rapid heart beat [Expected] Other reaction(s): Tachycardia  . Other     Other reaction(s): tachycardia  . Procaine Other (See Comments)    Increased heart rate Other reaction(s): tachycardia    Objective:  There were no vitals filed for this visit. Constitutional Patient is a pleasant 69 y.o. Caucasian female obese in NAD. AAO x 3.  Vascular Capillary fill time to digits <3 seconds b/l lower extremities. Palpable pedal pulses b/l LE. Pedal hair sparse. Lower extremity skin temperature gradient within normal limits. No pain with calf compression b/l. No cyanosis or clubbing noted.  Neurologic Normal speech. Protective sensation diminished with 10g monofilament b/l. Vibratory sensation decreased b/l.  Dermatologic Pedal skin with normal turgor, texture and tone bilaterally. No open wounds bilaterally. No interdigital macerations bilaterally. Toenails 1-5 b/l elongated, discolored, dystrophic, thickened, crumbly with subungual debris and tenderness to dorsal palpation. Hyperkeratotic lesion(s) submet head 1 left foot and submet head 1 right foot.  No  erythema, no edema, no drainage, no fluctuance. Porokeratotic lesion(s) submet head 3 left foot. No erythema, no edema, no drainage, no fluctuance.  Orthopedic: Normal muscle strength 5/5 to all lower extremity muscle groups bilaterally. No pain crepitus or joint limitation noted with ROM b/l. No gross bony deformities bilaterally. Hammertoes noted to the 1-5 bilaterally.    Assessment:   1. Pain due to onychomycosis of toenails of both feet   2. Callus   3. Porokeratosis   4. Acquired hammertoes of both feet   5. Diabetic peripheral neuropathy associated with type 2 diabetes mellitus ([place])    Plan:  Patient was evaluated and treated and all questions answered.  Onychomycosis with pain -Nails palliatively debridement as below. -Educated on self-care  Procedure: Nail Debridement Rationale: Pain Type of Debridement: manual, sharp debridement. Instrumentation: Nail nipper, rotary burr. Number of Nails: 10  -Examined patient. -Continue diabetic foot care principles. -Patient to continue soft, supportive shoe gear daily. -Toenails 1-5 b/l were debrided in length and girth with sterile nail nippers and dremel without iatrogenic bleeding.  -Callus(es) submet head 1 left foot and submet head 1 right foot pared utilizing sterile scalpel blade without complication or incident. Total number debrided =2. -Painful porokeratotic lesion(s) submet head 3 left foot pared and enucleated with sterile scalpel blade without incident. -Dispensed one pair diabetic shoes and 3 pair total contact insoles. Shoes were appropriate fit with no heel slippage. Reviewed warranty information and patient signed all paperwork stating patient received shoes, insert(s)/filler(s), break-in instructions and warranty information. Patient instructed not to wear shoes outside unless completely satisfied. Patient related understanding. -Patient to report any pedal injuries to medical professional immediately. -Patient/POA to  call should there be question/concern in the  interim.  Return in about 3 months (around 01/24/2021).  Marzetta Board, DPM

## 2020-10-31 NOTE — Progress Notes (Signed)
Pt picked up Diabetic shoes and inserts. Pt was giving fitting instruction.

## 2020-11-24 ENCOUNTER — Ambulatory Visit: Payer: Medicare Other

## 2021-02-16 ENCOUNTER — Ambulatory Visit: Payer: Medicare Other | Admitting: Podiatry

## 2021-03-21 ENCOUNTER — Ambulatory Visit: Payer: Medicare Other | Admitting: Podiatry

## 2021-04-24 ENCOUNTER — Other Ambulatory Visit: Payer: Self-pay

## 2021-04-24 ENCOUNTER — Ambulatory Visit (INDEPENDENT_AMBULATORY_CARE_PROVIDER_SITE_OTHER): Payer: Medicare Other | Admitting: Podiatry

## 2021-04-24 ENCOUNTER — Encounter: Payer: Self-pay | Admitting: Podiatry

## 2021-04-24 DIAGNOSIS — M79675 Pain in left toe(s): Secondary | ICD-10-CM | POA: Diagnosis not present

## 2021-04-24 DIAGNOSIS — L84 Corns and callosities: Secondary | ICD-10-CM

## 2021-04-24 DIAGNOSIS — Q828 Other specified congenital malformations of skin: Secondary | ICD-10-CM

## 2021-04-24 DIAGNOSIS — M79674 Pain in right toe(s): Secondary | ICD-10-CM | POA: Diagnosis not present

## 2021-04-24 DIAGNOSIS — M8580 Other specified disorders of bone density and structure, unspecified site: Secondary | ICD-10-CM | POA: Insufficient documentation

## 2021-04-24 DIAGNOSIS — E079 Disorder of thyroid, unspecified: Secondary | ICD-10-CM | POA: Insufficient documentation

## 2021-04-24 DIAGNOSIS — E1142 Type 2 diabetes mellitus with diabetic polyneuropathy: Secondary | ICD-10-CM

## 2021-04-24 DIAGNOSIS — B351 Tinea unguium: Secondary | ICD-10-CM | POA: Diagnosis not present

## 2021-04-29 NOTE — Progress Notes (Signed)
  Subjective:  Patient ID: Amy Russo, female    DOB: 1951-03-29,  MRN: [id number]  Amy Russo presents to clinic today for at risk foot care with history of diabetic neuropathy and callus(es) b/l and painful thick toenails that are difficult to trim. Painful toenails interfere with ambulation. Aggravating factors include wearing enclosed shoe gear. Pain is relieved with periodic professional debridement. Painful calluses are aggravated when weightbearing with and without shoegear. Pain is relieved with periodic professional debridement.  Patient states blood glucose was 156 mg/dl today.  PCP is Burnard Bunting, MD , and last visit was 03/07/2021.  Allergies  Allergen Reactions   Epinephrine Palpitations and Other (See Comments)    Rapid heart beat [Expected] Other reaction(s): Tachycardia   Other     Other reaction(s): tachycardia   Procaine Other (See Comments)    Increased heart rate Other reaction(s): tachycardia    Review of Systems: Negative except as noted in the HPI. Objective:   Constitutional Amy Russo is a pleasant 70 y.o. Caucasian female, in NAD. AAO x 3.   Vascular Capillary fill time to digits <3 seconds b/l lower extremities. Palpable pedal pulses b/l LE. Pedal hair sparse. Lower extremity skin temperature gradient within normal limits. No pain with calf compression b/l. No cyanosis or clubbing noted.  Neurologic Normal speech. Oriented to person, place, and time. Protective sensation diminished with 10g monofilament b/l. Vibratory sensation decreased b/l.  Dermatologic Pedal skin with normal turgor, texture and tone b/l lower extremities. No open wounds b/l lower extremities. Toenails 1-5 b/l elongated, discolored, dystrophic, thickened, crumbly with subungual debris and tenderness to dorsal palpation. Hyperkeratotic lesion(s) submet head 1 left foot and submet head 1 right foot.  No erythema, no edema, no drainage, no fluctuance. Porokeratotic lesion(s)  submet head 3 left foot. No erythema, no edema, no drainage, no fluctuance.  Orthopedic: Normal muscle strength 5/5 to all lower extremity muscle groups bilaterally. No pain crepitus or joint limitation noted with ROM b/l lower extremities. Hammertoe(s) noted to the 1-5 bilaterally.   Radiographs: None Assessment:   1. Pain due to onychomycosis of toenails of both feet   2. Callus   3. Porokeratosis   4. Diabetic peripheral neuropathy associated with type 2 diabetes mellitus ([place])    Plan:  -Examined patient. -Continue diabetic foot care principles: inspect feet daily, monitor glucose as recommended by PCP and/or Endocrinologist, and follow prescribed diet per PCP, Endocrinologist and/or dietician. -Patient to continue soft, supportive shoe gear daily. -Toenails 1-5 b/l were debrided in length and girth with sterile nail nippers and dremel without iatrogenic bleeding.  -Callus(es) submet head 1 left foot and submet head 1 right foot pared utilizing sterile scalpel blade. Pinpoint bleeding submet head 1 right foot addressed with Lumicain Hemostatic Solution. Cleansed with alcohol and triple antibiotic ointment applied. No further treatment required by patient.Total number debrided =2. -Painful porokeratotic lesion(s) submet head 3 left foot pared and enucleated with sterile scalpel blade without incident. Total number of lesions debrided=1. -Patient to report any pedal injuries to medical professional immediately. -Patient/POA to call should there be question/concern in the interim.  Return in about 3 months (around 07/25/2021).  Marzetta Board, DPM

## 2021-08-01 ENCOUNTER — Ambulatory Visit: Payer: Medicare Other | Admitting: Podiatry

## 2021-10-19 DIAGNOSIS — G5601 Carpal tunnel syndrome, right upper limb: Secondary | ICD-10-CM | POA: Insufficient documentation

## 2021-10-31 ENCOUNTER — Other Ambulatory Visit: Payer: Self-pay

## 2021-10-31 ENCOUNTER — Ambulatory Visit (INDEPENDENT_AMBULATORY_CARE_PROVIDER_SITE_OTHER): Payer: Medicare Other | Admitting: Podiatry

## 2021-10-31 DIAGNOSIS — M2041 Other hammer toe(s) (acquired), right foot: Secondary | ICD-10-CM

## 2021-10-31 DIAGNOSIS — M79675 Pain in left toe(s): Secondary | ICD-10-CM

## 2021-10-31 DIAGNOSIS — M62838 Other muscle spasm: Secondary | ICD-10-CM | POA: Insufficient documentation

## 2021-10-31 DIAGNOSIS — E1142 Type 2 diabetes mellitus with diabetic polyneuropathy: Secondary | ICD-10-CM

## 2021-10-31 DIAGNOSIS — M2011 Hallux valgus (acquired), right foot: Secondary | ICD-10-CM | POA: Diagnosis not present

## 2021-10-31 DIAGNOSIS — M2042 Other hammer toe(s) (acquired), left foot: Secondary | ICD-10-CM

## 2021-10-31 DIAGNOSIS — M79674 Pain in right toe(s): Secondary | ICD-10-CM

## 2021-10-31 DIAGNOSIS — M2012 Hallux valgus (acquired), left foot: Secondary | ICD-10-CM

## 2021-10-31 DIAGNOSIS — B351 Tinea unguium: Secondary | ICD-10-CM | POA: Diagnosis not present

## 2021-10-31 DIAGNOSIS — M51369 Other intervertebral disc degeneration, lumbar region without mention of lumbar back pain or lower extremity pain: Secondary | ICD-10-CM | POA: Insufficient documentation

## 2021-10-31 DIAGNOSIS — M5136 Other intervertebral disc degeneration, lumbar region: Secondary | ICD-10-CM | POA: Insufficient documentation

## 2021-10-31 DIAGNOSIS — M1990 Unspecified osteoarthritis, unspecified site: Secondary | ICD-10-CM | POA: Insufficient documentation

## 2021-10-31 DIAGNOSIS — E119 Type 2 diabetes mellitus without complications: Secondary | ICD-10-CM

## 2021-11-07 ENCOUNTER — Encounter: Payer: Self-pay | Admitting: Podiatry

## 2021-11-07 NOTE — Progress Notes (Signed)
ANNUAL DIABETIC FOOT EXAM  Subjective: Amy Russo presents today for annual diabetic foot examination.  Patient relates 30 year h/o diabetes.  Patient denies any h/o foot wounds.  Patient denies any numbness, tingling, burning, or pins/needle sensation in feet.  Patient's blood sugar was >200 mg/dl today. She states last A1c was around 9%.  Risk factors: diabetes, HTN, hyperlipidemia.  Burnard Bunting, MD is patient's PCP. Last visit was March 07, 2021.  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   Arthritis    Barrett's esophagus    Carpal tunnel syndrome    right   Cataract    immature and not sure which eye   Chronic back pain    spondylolisthesis/DDD/stenosis   Diabetes mellitus    takes Novolog and Lantus daily   Diverticulosis    Esophageal stricture    GERD (gastroesophageal reflux disease)    takes Omeprazole daily   History of blood transfusion    no abnormal reaction noted   History of colon polyps    benign   History of shingles    HTN (hypertension)    takes Lisinopril daily   Hyperlipidemia    takes Atorvastatin daily   Hypothyroidism    takes Synthroid daily   Insomnia    Takes Restoril daily   Joint pain    Joint swelling    Psoriasis    Weakness    numbness and tingling   Patient Active Problem List   Diagnosis Date Noted   Degeneration of lumbar intervertebral disc 10/31/2021   Degenerative joint disease 10/31/2021   Muscle spasm 10/31/2021   Right carpal tunnel syndrome 10/19/2021   Osteopenia 04/24/2021   Thyroid dysfunction 04/24/2021   Bleeding per rectum 10/29/2020   Constipation 10/29/2020   Diverticular disease of colon 10/29/2020   Family history of malignant neoplasm of gastrointestinal tract 10/29/2020   Gastroesophageal reflux disease with esophagitis 10/29/2020   GERD (gastroesophageal reflux disease) 07/18/2020   Chronic back pain 07/18/2020   Hyperlipidemia 07/18/2020    Tinnitus, bilateral 05/29/2020   Hearing loss 05/29/2020   Lumbosacral spondylosis without myelopathy 12/09/2017   Bilateral impacted cerumen 02/10/2017   Hypertensive retinopathy of both eyes 01/09/2016   Spondylolisthesis of lumbar region 11/17/2015   Elbow joint pain 04/18/2015   Nuclear cataract of both eyes 10/18/2014   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[place]) 09/15/2013   Epiretinal membrane, left eye 02/16/2013   Non-proliferative diabetic retinopathy ([place]) 05/01/2012   S/P AVR (aortic valve replacement) 09/27/2011   Aortic valve disease    Diabetes mellitus    HTN (hypertension)    Obesity    Past Surgical History:  Procedure Laterality Date   AVR  12/2006   #23 [facility] pericardial valve   AXILLARY ARTERY ANEURYSM REPAIR Left 1958   CARPAL TUNNEL RELEASE Left    COLONOSCOPY     cyst removed from left ear     ESOPHAGOGASTRODUODENOSCOPY     [place]   TRIGGER FINGER RELEASE Left    TUBAL LIGATION     Current Outpatient Medications on File Prior to Visit  Medication Sig Dispense Refill   amLODIPine-Valsartan-HCTZ 10-160-25 MG TABS      amoxicillin (AMOXIL) 500 MG capsule Take 1,000 mg by mouth 2 (two) times daily.     aspirin (ASPIRIN 81) 81 MG chewable tablet 1 tablet     aspirin 81 MG tablet Take 81 mg by  mouth daily.      atorvastatin (LIPITOR) 20 MG tablet Take 1 tablet by mouth daily.  0   Calcium Carbonate-Vitamin D (CALCIUM + D PO) Take 600-800 mg by mouth daily.     Calcium Carbonate-Vitamin D 600-400 MG-UNIT tablet 1 tablet with food     Cyanocobalamin (VITAMIN B-12 PO) Take 1,000 mcg by mouth daily.      escitalopram (LEXAPRO) 10 MG tablet escitalopram 10 mg tablet  TAKE 1 TABLET BY MOUTH EVERY DAY     hydrochlorothiazide (HYDRODIURIL) 25 MG tablet Take 25 mg by mouth daily.     insulin aspart (NOVOLOG) 100 UNIT/ML injection Novolog U-100 Insulin aspart 100 unit/mL subcutaneous solution  INJECT UP TO 33 UNITS PER DAY IN 4 DIVIDED  DOSES AS DIRECTED. DX  E10.65     insulin detemir (LEVEMIR) 100 UNIT/ML injection Levemir U-100 Insulin 100 unit/mL subcutaneous solution  INJECT 35 UNITS AT BEDTIME AS DIRECTED     Insulin Syringe-Needle U-100 (BD INSULIN SYRINGE U/F) 30G X 1/2" 0.5 ML MISC BD Insulin Syringe Ultra-Fine 0.5 mL 31 gauge x 5/16"  USE TO INJECT INSULIN UPTO 5TIMES A DAY E10.65     LEVEMIR 100 UNIT/ML injection Inject 30 Units into the skin at bedtime.      levothyroxine (SYNTHROID) 75 MCG tablet Synthroid 75 mcg tablet  TAKE 1 TABLET BY MOUTH EVERY DAY     levothyroxine (SYNTHROID) 75 MCG tablet 1 tablet every morning on an empty stomach     lisinopril (ZESTRIL) 20 MG tablet lisinopril 20 mg tablet  TAKE 1 TABLET BY MOUTH TWICE A DAY     methylPREDNISolone (MEDROL DOSEPAK) 4 MG TBPK tablet Take by mouth.     Multiple Vitamin (MULTI-VITAMIN PO) Take 1 tablet by mouth daily.      Multiple Vitamins-Minerals (ONE DAILY WOMENS 50 PLUS PO) 1 tablet     omeprazole (PRILOSEC) 40 MG capsule 1 capsule 30 minutes before morning meal     omeprazole (PRILOSEC) 40 MG capsule Take 40 mg by mouth every morning.     ONETOUCH ULTRA test strip 1 each 3 (three) times daily.     oxyCODONE-acetaminophen (PERCOCET/ROXICET) 5-325 MG tablet Take 1-2 tablets by mouth every 6 (six) hours as needed for moderate pain. 70 tablet 0   Pyridoxine HCl (VITAMIN B-6 PO) Take 200 mg by mouth daily.      senna (SENOKOT) 8.6 MG TABS tablet 2 tablets at bedtime as needed     temazepam (RESTORIL) 15 MG capsule Take 1-2 capsules by mouth at bedtime as needed.   2   tiZANidine (ZANAFLEX) 4 MG tablet Take 1 tablet (4 mg total) by mouth every 6 (six) hours as needed for muscle spasms. 60 tablet 0   Turmeric 500 MG CAPS Take 2 capsules by mouth daily.     No current facility-administered medications on file prior to visit.    Allergies  Allergen Reactions   Epinephrine Palpitations and Other (See Comments)    Rapid heart beat [Expected] Other  reaction(s): Tachycardia   Other     Other reaction(s): tachycardia   Procaine Other (See Comments) and Palpitations    Increased heart rate Other reaction(s): tachycardia   Social History   Occupational History   Not on file  Tobacco Use   Smoking status: Former   Smokeless tobacco: Never   Tobacco comments:    quit smoking 30+ yrs ago  Substance and Sexual Activity   Alcohol use: Yes    Alcohol/week: 0.0 standard  drinks    Comment: ocassionally   Drug use: No   Sexual activity: Yes   No family history on file.  There is no immunization history on file for this patient.   Review of Systems: Negative except as noted in the HPI.   Objective: There were no vitals filed for this visit.  Amy Russo is a pleasant 71 y.o. female in NAD. AAO X 3.  Vascular Examination: CFT <3 seconds b/l LE. Palpable pedal pulses b/l LE. Pedal hair sparse. No pain with calf compression b/l. Lower extremity skin temperature gradient within normal limits. No edema noted b/l LE. No cyanosis or clubbing noted b/l LE.  Dermatological Examination: Pedal skin is warm and supple b/l LE. No open wounds b/l LE. No interdigital macerations noted b/l LE. Toenails 1-5 b/l elongated, discolored, dystrophic, thickened, crumbly with subungual debris and tenderness to dorsal palpation. No hyperkeratotic nor porokeratotic lesions present on today's visit.  Neurological Examination: Protective sensation diminished with 10g monofilament b/l. Vibratory sensation decreased b/l.  Musculoskeletal Examination: Muscle strength 5/5 to all lower extremity muscle groups bilaterally. No pain, crepitus or joint limitation noted with ROM bilateral LE. HAV with bunion deformity noted b/l LE. Hammertoe deformity noted 2-5 b/l. Wearing appropriate fitting shoe gear.  Footwear Assessment: Does the patient wear appropriate shoes? Yes. Does the patient need inserts/orthotics? No.  Assessment: 1. Pain due to onychomycosis  of toenails of both feet   2. Diabetic peripheral neuropathy associated with type 2 diabetes mellitus ([place])   3. Acquired hammertoes of both feet   4. Hallux valgus, acquired, bilateral   5. Encounter for diabetic foot exam ([place])     ADA Risk Categorization: High Risk  Patient has one or more of the following: Loss of protective sensation Absent pedal pulses Severe Foot deformity History of foot ulcer  Plan: -Diabetic foot examination performed today. -Continue foot and shoe inspections daily. Monitor blood glucose per PCP/Endocrinologist's recommendations. -Mycotic toenails 1-5 bilaterally were debrided in length and girth with sterile nail nippers and dremel without incident. -Patient/POA to call should there be question/concern in the interim. Return in about 3 months (around 01/31/2022).  Marzetta Board, DPM

## 2022-02-06 ENCOUNTER — Ambulatory Visit: Payer: Medicare Other | Admitting: Podiatry

## 2022-06-26 ENCOUNTER — Ambulatory Visit: Payer: Medicare Other | Admitting: Podiatry

## 2022-07-30 ENCOUNTER — Ambulatory Visit (INDEPENDENT_AMBULATORY_CARE_PROVIDER_SITE_OTHER): Payer: Medicare Other | Admitting: Podiatry

## 2022-07-30 ENCOUNTER — Encounter: Payer: Self-pay | Admitting: Podiatry

## 2022-07-30 DIAGNOSIS — M79674 Pain in right toe(s): Secondary | ICD-10-CM

## 2022-07-30 DIAGNOSIS — M79675 Pain in left toe(s): Secondary | ICD-10-CM | POA: Diagnosis not present

## 2022-07-30 DIAGNOSIS — L84 Corns and callosities: Secondary | ICD-10-CM

## 2022-07-30 DIAGNOSIS — E1142 Type 2 diabetes mellitus with diabetic polyneuropathy: Secondary | ICD-10-CM

## 2022-07-30 DIAGNOSIS — B351 Tinea unguium: Secondary | ICD-10-CM | POA: Diagnosis not present

## 2022-07-30 NOTE — Progress Notes (Signed)
  Subjective:  Patient ID: Amy Russo, female    DOB: September 03, 1950,  MRN: [id number]  Amy Russo presents to clinic today for at risk foot care with history of diabetic neuropathy and callus(es) both feet and painful thick toenails that are difficult to trim. Painful toenails interfere with ambulation. Aggravating factors include wearing enclosed shoe gear. Pain is relieved with periodic professional debridement. Painful calluses are aggravated when weightbearing with and without shoegear. Pain is relieved with periodic professional debridement.  Chief Complaint  Patient presents with   Nail Problem    Diabetic foot care [id number] A1C-9.0 PCP -Reynaldo Minium PCP VST-07/24/2022    New problem(s): None.   PCP is Burnard Bunting, MD.  Allergies  Allergen Reactions   Epinephrine Palpitations and Other (See Comments)    Rapid heart beat [Expected] Other reaction(s): Tachycardia   Other     Other reaction(s): tachycardia   Procaine Other (See Comments) and Palpitations    Increased heart rate Other reaction(s): tachycardia    Review of Systems: Negative except as noted in the HPI.  Objective: No changes noted in today's physical examination.  Amy Russo is a pleasant 71 y.o. female obese in NAD. AAO x 3.  Vascular Examination: CFT <3 seconds b/l LE. Palpable pedal pulses b/l LE. Pedal hair sparse. No pain with calf compression b/l. Lower extremity skin temperature gradient within normal limits. No edema noted b/l LE. No cyanosis or clubbing noted b/l LE.  Dermatological Examination: Pedal skin is warm and supple b/l LE. No open wounds b/l LE. No interdigital macerations noted b/l LE. Toenails 1-5 b/l elongated, discolored, dystrophic, thickened, crumbly with subungual debris and tenderness to dorsal palpation.   Hyperkeratotic lesion(s) submet head 1 b/l and submet head 4 left foot.  No erythema, no edema, no drainage, no fluctuance.  Neurological Examination: Protective  sensation diminished with 10g monofilament b/l. Vibratory sensation decreased b/l.  Musculoskeletal Examination: Muscle strength 5/5 to all lower extremity muscle groups bilaterally. No pain, crepitus or joint limitation noted with ROM bilateral LE. HAV with bunion deformity noted b/l LE. Hammertoe deformity noted 2-5 b/l. Wearing appropriate fitting shoe gear.  Assessment/Plan: 1. Pain due to onychomycosis of toenails of both feet   2. Callus   3. Diabetic peripheral neuropathy associated with type 2 diabetes mellitus ([place])     No orders of the defined types were placed in this encounter.   -Patient was evaluated and treated. All patient's and/or POA's questions/concerns answered on today's visit. -Continue diabetic foot care principles: inspect feet daily, monitor glucose as recommended by PCP and/or Endocrinologist, and follow prescribed diet per PCP, Endocrinologist and/or dietician. -Patient to continue soft, supportive shoe gear daily. -Toenails 1-5 b/l were debrided in length and girth with sterile nail nippers and dremel without iatrogenic bleeding.  -Callus(es) submet head 4 left foot pared utilizing sterile scalpel blade without complication or incident. Total number debrided =1. -Callus(es) submet head 1 b/l pared utilizing rotary bur without complication or incident. Total number pared =2. -Patient/POA to call should there be question/concern in the interim.   Return in about 3 months (around 10/30/2022).  Marzetta Board, DPM

## 2022-10-30 ENCOUNTER — Other Ambulatory Visit: Payer: Medicare Other

## 2022-11-05 ENCOUNTER — Other Ambulatory Visit: Payer: Medicare Other

## 2022-11-05 ENCOUNTER — Ambulatory Visit: Payer: Medicare Other | Admitting: Podiatry

## 2022-12-18 ENCOUNTER — Ambulatory Visit (INDEPENDENT_AMBULATORY_CARE_PROVIDER_SITE_OTHER): Payer: Medicare Other | Admitting: Podiatry

## 2022-12-18 ENCOUNTER — Encounter: Payer: Self-pay | Admitting: Podiatry

## 2022-12-18 DIAGNOSIS — E1142 Type 2 diabetes mellitus with diabetic polyneuropathy: Secondary | ICD-10-CM

## 2022-12-18 DIAGNOSIS — L84 Corns and callosities: Secondary | ICD-10-CM

## 2022-12-18 DIAGNOSIS — M79675 Pain in left toe(s): Secondary | ICD-10-CM | POA: Diagnosis not present

## 2022-12-18 DIAGNOSIS — M79674 Pain in right toe(s): Secondary | ICD-10-CM | POA: Diagnosis not present

## 2022-12-18 DIAGNOSIS — B351 Tinea unguium: Secondary | ICD-10-CM

## 2022-12-18 DIAGNOSIS — M2011 Hallux valgus (acquired), right foot: Secondary | ICD-10-CM

## 2022-12-18 DIAGNOSIS — M2012 Hallux valgus (acquired), left foot: Secondary | ICD-10-CM

## 2022-12-18 NOTE — Progress Notes (Signed)
This patient returns to my office for at risk foot care.  This patient requires this care by a professional since this patient will be at risk due to having diabetic neuropathy.  This patient is unable to cut nails herself since the patient cannot reach her nails.These nails are painful walking and wearing shoes.  This patient presents for at risk foot care today.  General Appearance  Alert, conversant and in no acute stress.  Vascular  Dorsalis pedis and posterior tibial  pulses are palpable  bilaterally.  Capillary return is within normal limits  bilaterally. Temperature is within normal limits  bilaterally.  Neurologic  Senn-Weinstein monofilament wire test diminished  bilaterally. Muscle power within normal limits bilaterally.  Nails Thick disfigured discolored nails with subungual debris  from hallux to fifth toes bilaterally. No evidence of bacterial infection or drainage bilaterally.  Orthopedic  No limitations of motion  feet .  No crepitus or effusions noted.Marland Kitchen  HAV  B/L.  Skin  normotropic skin with no porokeratosis noted bilaterally.  No signs of infections or ulcers noted.   Diffuse plantar tyloma.  Onychomycosis  Pain in right toes  Pain in left toes  Consent was obtained for treatment procedures.   Mechanical debridement of nails 1-5  bilaterally performed with a nail nipper.  Filed with dremel without incident.    Return office visit    3 months                  Told patient to return for periodic foot care and evaluation due to potential at risk complications.   Helane Gunther DPM

## 2023-03-21 ENCOUNTER — Ambulatory Visit: Payer: Medicare Other | Admitting: Podiatry

## 2023-03-31 ENCOUNTER — Ambulatory Visit: Payer: Medicare Other | Admitting: Podiatry

## 2023-03-31 ENCOUNTER — Encounter: Payer: Self-pay | Admitting: Podiatry

## 2023-03-31 VITALS — BP 177/88 | HR 83

## 2023-03-31 DIAGNOSIS — M2012 Hallux valgus (acquired), left foot: Secondary | ICD-10-CM | POA: Diagnosis not present

## 2023-03-31 DIAGNOSIS — M2011 Hallux valgus (acquired), right foot: Secondary | ICD-10-CM

## 2023-03-31 DIAGNOSIS — M2041 Other hammer toe(s) (acquired), right foot: Secondary | ICD-10-CM | POA: Diagnosis not present

## 2023-03-31 DIAGNOSIS — M79674 Pain in right toe(s): Secondary | ICD-10-CM | POA: Diagnosis not present

## 2023-03-31 DIAGNOSIS — B351 Tinea unguium: Secondary | ICD-10-CM | POA: Diagnosis not present

## 2023-03-31 DIAGNOSIS — M79675 Pain in left toe(s): Secondary | ICD-10-CM

## 2023-03-31 DIAGNOSIS — E1142 Type 2 diabetes mellitus with diabetic polyneuropathy: Secondary | ICD-10-CM

## 2023-03-31 DIAGNOSIS — M2042 Other hammer toe(s) (acquired), left foot: Secondary | ICD-10-CM

## 2023-03-31 NOTE — Progress Notes (Signed)
This patient returns to my office for at risk foot care.  This patient requires this care by a professional since this patient will be at risk due to having diabetic neuropathy.  This patient is unable to cut nails herself since the patient cannot reach her nails.These nails are painful walking and wearing shoes.  This patient presents for at risk foot care today.  General Appearance  Alert, conversant and in no acute stress.  Vascular  Dorsalis pedis and posterior tibial  pulses are palpable  bilaterally.  Capillary return is within normal limits  bilaterally. Temperature is within normal limits  bilaterally.  Neurologic  Senn-Weinstein monofilament wire test diminished  bilaterally. Muscle power within normal limits bilaterally.  Nails Thick disfigured discolored nails with subungual debris  from hallux to fifth toes bilaterally. No evidence of bacterial infection or drainage bilaterally.  Orthopedic  No limitations of motion  feet .  No crepitus or effusions noted.Marland Kitchen  HAV  B/L.  Skin  normotropic skin with no porokeratosis noted bilaterally.  No signs of infections or ulcers noted.   Diffuse plantar tyloma.  Onychomycosis  Pain in right toes  Pain in left toes  DPN  HAV  B/L.  Consent was obtained for treatment procedures.   Mechanical debridement of nails 1-5  bilaterally performed with a nail nipper.  Filed with dremel without incident.    Return office visit    3 months                  Told patient to return for periodic foot care and evaluation due to potential at risk complications. Dispense diabetic shoe.  Shoes fit well.  Shoes signed for jean.  RTC prn.   Helane Gunther DPM

## 2023-04-03 ENCOUNTER — Ambulatory Visit: Payer: Medicare Other | Admitting: Podiatry

## 2023-04-09 ENCOUNTER — Ambulatory Visit: Payer: Medicare Other | Admitting: Podiatry

## 2023-06-23 ENCOUNTER — Ambulatory Visit (INDEPENDENT_AMBULATORY_CARE_PROVIDER_SITE_OTHER): Payer: Medicare Other | Admitting: Podiatry

## 2023-06-23 ENCOUNTER — Encounter: Payer: Self-pay | Admitting: Podiatry

## 2023-06-23 DIAGNOSIS — E1142 Type 2 diabetes mellitus with diabetic polyneuropathy: Secondary | ICD-10-CM

## 2023-06-23 DIAGNOSIS — M79675 Pain in left toe(s): Secondary | ICD-10-CM | POA: Diagnosis not present

## 2023-06-23 DIAGNOSIS — B351 Tinea unguium: Secondary | ICD-10-CM | POA: Diagnosis not present

## 2023-06-23 DIAGNOSIS — M79674 Pain in right toe(s): Secondary | ICD-10-CM

## 2023-06-23 NOTE — Progress Notes (Signed)
This patient returns to my office for at risk foot care.  This patient requires this care by a professional since this patient will be at risk due to having diabetic neuropathy.  This patient is unable to cut nails herself since the patient cannot reach her nails.These nails are painful walking and wearing shoes.  This patient presents for at risk foot care today.  General Appearance  Alert, conversant and in no acute stress.  Vascular  Dorsalis pedis and posterior tibial  pulses are palpable  bilaterally.  Capillary return is within normal limits  bilaterally. Temperature is within normal limits  bilaterally.  Neurologic  Senn-Weinstein monofilament wire test diminished  bilaterally. Muscle power within normal limits bilaterally.  Nails Thick disfigured discolored nails with subungual debris  from hallux to fifth toes bilaterally. No evidence of bacterial infection or drainage bilaterally.  Orthopedic  No limitations of motion  feet .  No crepitus or effusions noted.Marland Kitchen  HAV  B/L.  Skin  normotropic skin with no porokeratosis noted bilaterally.  No signs of infections or ulcers noted.   Diffuse plantar tyloma.  Onychomycosis  Pain in right toes  Pain in left toes  DPN  HAV  B/L.  Consent was obtained for treatment procedures.   Mechanical debridement of nails 1-5  bilaterally performed with a nail nipper.  Filed with dremel without incident.    Return office visit    3 months                  Told patient to return for periodic foot care and evaluation due to potential at risk complications.   Helane Gunther DPM

## 2023-07-02 ENCOUNTER — Ambulatory Visit: Payer: Medicare Other | Admitting: Podiatry

## 2023-12-10 ENCOUNTER — Ambulatory Visit: Admitting: Podiatry

## 2023-12-24 ENCOUNTER — Ambulatory Visit: Admitting: Podiatry

## 2024-05-26 ENCOUNTER — Encounter: Payer: Self-pay | Admitting: Podiatry

## 2024-05-26 ENCOUNTER — Ambulatory Visit (INDEPENDENT_AMBULATORY_CARE_PROVIDER_SITE_OTHER): Admitting: Podiatry

## 2024-05-26 DIAGNOSIS — M79674 Pain in right toe(s): Secondary | ICD-10-CM

## 2024-05-26 DIAGNOSIS — B351 Tinea unguium: Secondary | ICD-10-CM | POA: Diagnosis not present

## 2024-05-26 DIAGNOSIS — E1142 Type 2 diabetes mellitus with diabetic polyneuropathy: Secondary | ICD-10-CM

## 2024-05-26 DIAGNOSIS — M79675 Pain in left toe(s): Secondary | ICD-10-CM | POA: Diagnosis not present

## 2024-05-26 NOTE — Progress Notes (Signed)
  Subjective:  Patient ID: Amy Russo, female    DOB: March 08, 1951,   MRN: [id number]  Chief Complaint  Patient presents with   Diabetes    Dr. Joya is going to do my nails.  I'd like her to do my annual physical.  Saw Dr. Griesmer - 05/24/2024; A1c - 7.7    73 y.o. female presents for concern of thickened elongated and painful nails that are difficult to trim. Requesting to have them trimmed today. Relates burning and tingling in their feet. Patient is diabetic and last A1c was  Lab Results  Component Value Date   HGBA1C 9.1 (H) 11/17/2015   .   PCP:  Shepard Ade, MD    . Denies any other pedal complaints. Denies n/v/f/c.   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   Arthritis    Barrett's esophagus    Carpal tunnel syndrome    right   Cataract    immature and not sure which eye   Chronic back pain    spondylolisthesis/DDD/stenosis   Diabetes mellitus    takes Novolog  and Lantus  daily   Diverticulosis    Esophageal stricture    GERD (gastroesophageal reflux disease)    takes Omeprazole daily   History of blood transfusion    no abnormal reaction noted   History of colon polyps    benign   History of shingles    HTN (hypertension)    takes Lisinopril  daily   Hyperlipidemia    takes Atorvastatin  daily   Hypothyroidism    takes Synthroid  daily   Insomnia    Takes Restoril  daily   Joint pain    Joint swelling    Psoriasis    Weakness    numbness and tingling    Objective:  Physical Exam: Vascular: DP/PT pulses 2/4 bilateral. CFT <3 seconds. Feet cold to touch. Absent hair growth on digits. Edema noted to bilateral lower extremities. Xerosis noted bilaterally.  Skin. No lacerations or abrasions bilateral feet. Nails 1-5 bilateral  are thickened discolored and elongated with subungual debris.  Musculoskeletal: MMT 5/5 bilateral lower extremities in DF, PF, Inversion and Eversion. Deceased ROM in DF of ankle  joint.  Neurological: Sensation intact to light touch. Protective sensation diminished bilateral.    Assessment:   1. Diabetic peripheral neuropathy associated with type 2 diabetes mellitus ([facility])   2. Pain due to onychomycosis of toenails of both feet      Plan:  Patient was evaluated and treated and all questions answered. -Discussed and educated patient on diabetic foot care, especially with  regards to the vascular, neurological and musculoskeletal systems.  -Stressed the importance of good glycemic control and the detriment of not  controlling glucose levels in relation to the foot. -Discussed supportive shoes at all times and checking feet regularly.  -Mechanically debrided all nails 1-5 bilateral using sterile nail nipper and filed with dremel without incident  -Answered all patient questions -Patient to return  in 3 months rfc -Patient advised to call the office if any problems or questions arise in the meantime.   Asberry Joya, DPM

## 2024-08-23 ENCOUNTER — Ambulatory Visit: Admitting: Podiatry

## 2024-08-23 ENCOUNTER — Encounter: Payer: Self-pay | Admitting: Podiatry

## 2024-08-23 DIAGNOSIS — B351 Tinea unguium: Secondary | ICD-10-CM

## 2024-08-23 DIAGNOSIS — M79674 Pain in right toe(s): Secondary | ICD-10-CM | POA: Diagnosis not present

## 2024-08-23 DIAGNOSIS — M79675 Pain in left toe(s): Secondary | ICD-10-CM | POA: Diagnosis not present

## 2024-08-23 DIAGNOSIS — E1142 Type 2 diabetes mellitus with diabetic polyneuropathy: Secondary | ICD-10-CM | POA: Diagnosis not present

## 2024-08-23 NOTE — Progress Notes (Signed)
"  °  Subjective:  Patient ID: Amy Russo, female    DOB: 1951/07/27,   MRN: [id number]  Chief Complaint  Patient presents with   Diabetes    I guess I'm here for a check-up.  Saw Dr. Griesmer - 07/08/2024; A1c - 7.7    73 y.o. female presents for concern of thickened elongated and painful nails that are difficult to trim. Requesting to have them trimmed today. Relates burning and tingling in their feet. Patient is diabetic and last A1c was  Lab Results  Component Value Date   HGBA1C 9.1 (H) 11/17/2015   .   PCP:  No primary care provider on file.    . Denies any other pedal complaints. Denies n/v/f/c.   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   Arthritis    Barrett's esophagus    Carpal tunnel syndrome    right   Cataract    immature and not sure which eye   Chronic back pain    spondylolisthesis/DDD/stenosis   Diabetes mellitus    takes Novolog  and Lantus  daily   Diverticulosis    Esophageal stricture    GERD (gastroesophageal reflux disease)    takes Omeprazole daily   History of blood transfusion    no abnormal reaction noted   History of colon polyps    benign   History of shingles    HTN (hypertension)    takes Lisinopril  daily   Hyperlipidemia    takes Atorvastatin  daily   Hypothyroidism    takes Synthroid  daily   Insomnia    Takes Restoril  daily   Joint pain    Joint swelling    Psoriasis    Weakness    numbness and tingling    Objective:  Physical Exam: Vascular: DP/PT pulses 2/4 bilateral. CFT <3 seconds. Feet cold to touch. Absent hair growth on digits. Edema noted to bilateral lower extremities. Xerosis noted bilaterally.  Skin. No lacerations or abrasions bilateral feet. Nails 1-5 bilateral  are thickened discolored and elongated with subungual debris.  Musculoskeletal: MMT 5/5 bilateral lower extremities in DF, PF, Inversion and Eversion. Deceased ROM in DF of ankle joint.  Neurological: Sensation  intact to light touch. Protective sensation diminished bilateral.    Assessment:   1. Pain due to onychomycosis of toenails of both feet   2. Diabetic peripheral neuropathy associated with type 2 diabetes mellitus ([facility]CC)      Plan:  Patient was evaluated and treated and all questions answered. -Discussed and educated patient on diabetic foot care, especially with  regards to the vascular, neurological and musculoskeletal systems.  -Stressed the importance of good glycemic control and the detriment of not  controlling glucose levels in relation to the foot. -Discussed supportive shoes at all times and checking feet regularly.  -Mechanically debrided all nails 1-5 bilateral using sterile nail nipper and filed with dremel without incident  -Answered all patient questions -Patient to return  in 3 months rfc -Patient advised to call the office if any problems or questions arise in the meantime.   Asberry Failing, DPM    "
